# Patient Record
Sex: Male | Born: 1985 | Race: Black or African American | Hispanic: No | Marital: Single | Smoking: Never smoker
Health system: Southern US, Community
[De-identification: ages and names within clinical notes are randomized; demographics above are authoritative.]

## PROBLEM LIST (undated history)

## (undated) DIAGNOSIS — J45909 Unspecified asthma, uncomplicated: Secondary | ICD-10-CM

---
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
## 2006-05-13 ENCOUNTER — Emergency Department (HOSPITAL_COMMUNITY): Admission: EM | Admit: 2006-05-13 | Discharge: 2006-05-13 | Payer: Self-pay | Admitting: *Deleted

## 2006-07-31 ENCOUNTER — Emergency Department (HOSPITAL_COMMUNITY): Admission: EM | Admit: 2006-07-31 | Discharge: 2006-08-01 | Payer: Self-pay | Admitting: Emergency Medicine

## 2006-09-05 ENCOUNTER — Emergency Department (HOSPITAL_COMMUNITY): Admission: EM | Admit: 2006-09-05 | Discharge: 2006-09-05 | Payer: Self-pay | Admitting: Family Medicine

## 2008-01-29 ENCOUNTER — Emergency Department (HOSPITAL_COMMUNITY): Admission: EM | Admit: 2008-01-29 | Discharge: 2008-01-29 | Payer: Self-pay | Admitting: Family Medicine

## 2008-05-12 ENCOUNTER — Emergency Department (HOSPITAL_BASED_OUTPATIENT_CLINIC_OR_DEPARTMENT_OTHER): Admission: EM | Admit: 2008-05-12 | Discharge: 2008-05-12 | Payer: Self-pay | Admitting: Emergency Medicine

## 2008-06-18 ENCOUNTER — Emergency Department (HOSPITAL_COMMUNITY): Admission: EM | Admit: 2008-06-18 | Discharge: 2008-06-18 | Payer: Self-pay | Admitting: Emergency Medicine

## 2009-03-23 ENCOUNTER — Emergency Department (HOSPITAL_COMMUNITY): Admission: EM | Admit: 2009-03-23 | Discharge: 2009-03-23 | Payer: Self-pay | Admitting: Emergency Medicine

## 2009-11-05 ENCOUNTER — Emergency Department (HOSPITAL_COMMUNITY): Admission: EM | Admit: 2009-11-05 | Discharge: 2009-11-05 | Payer: Self-pay | Admitting: Emergency Medicine

## 2009-11-06 ENCOUNTER — Emergency Department (HOSPITAL_COMMUNITY): Admission: EM | Admit: 2009-11-06 | Discharge: 2009-11-07 | Payer: Self-pay | Admitting: Emergency Medicine

## 2010-04-18 IMAGING — CT CT HEAD W/O CM
1 series · 16 of 30 positions shown, 20 images · non-contrast
Comparison: None

CLINICAL DATA: Elevated blood pressure.  Nausea.  Vomiting.
Headache.

CT HEAD WITHOUT CONTRAST
TECHNIQUE: Contiguous axial images were obtained from the base of
the skull through the vertex without contrast.

[Series 2: head_seq 4.5 h37s st · axial · 0.43mm/px · z∈[+1052,+1196]mm · 16 of 36 slices shown, 20 images]
[im 2/36  brain]
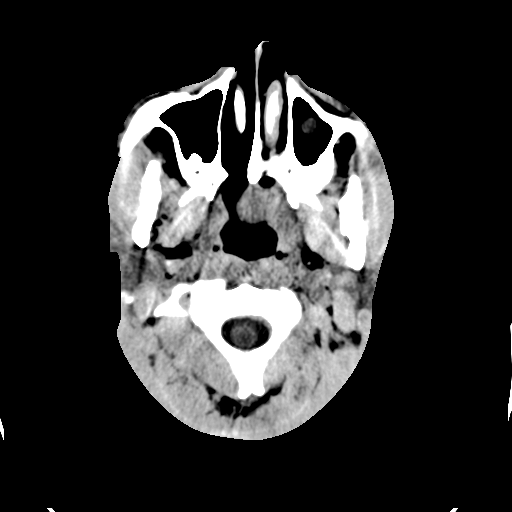
[im 2/36  bone]
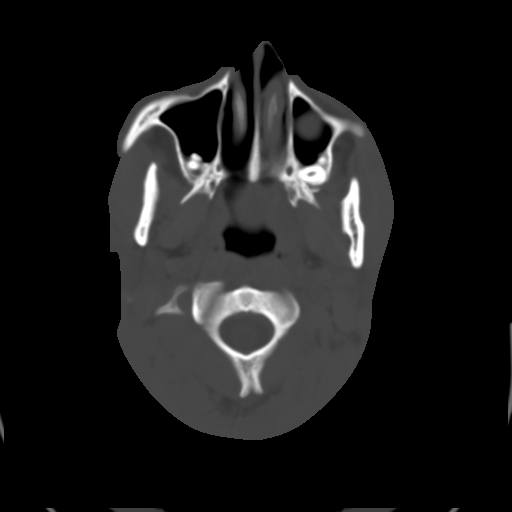
[im 4/36  brain]
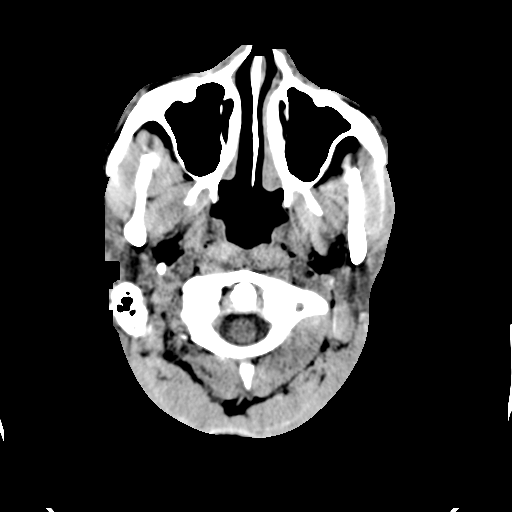
[im 7/36  brain]
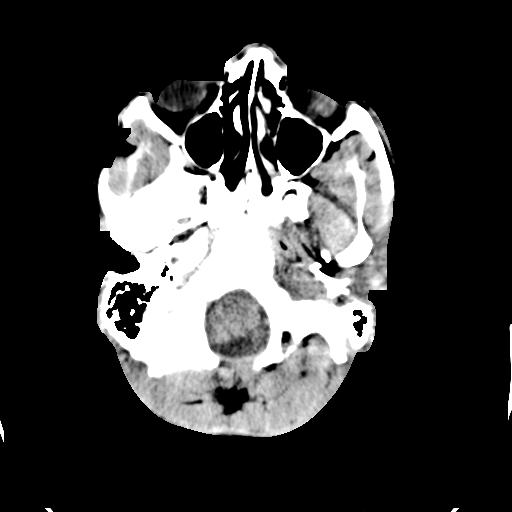
[im 9/36  brain]
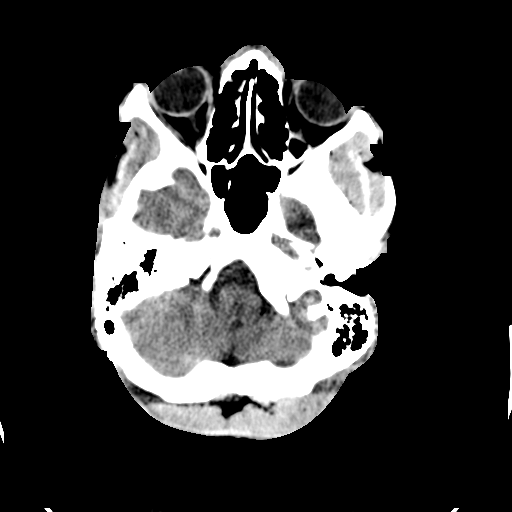
[im 10/36  brain]
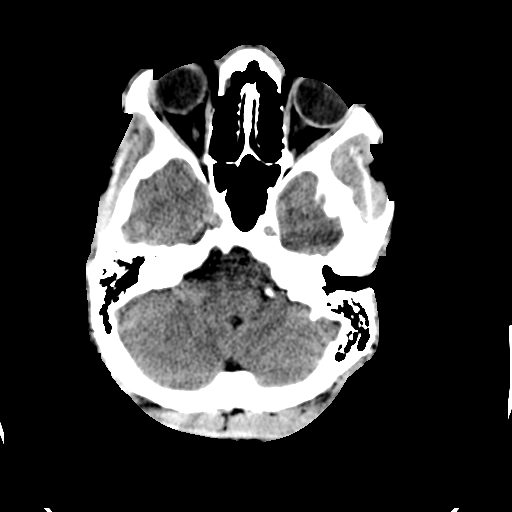
[im 10/36  bone]
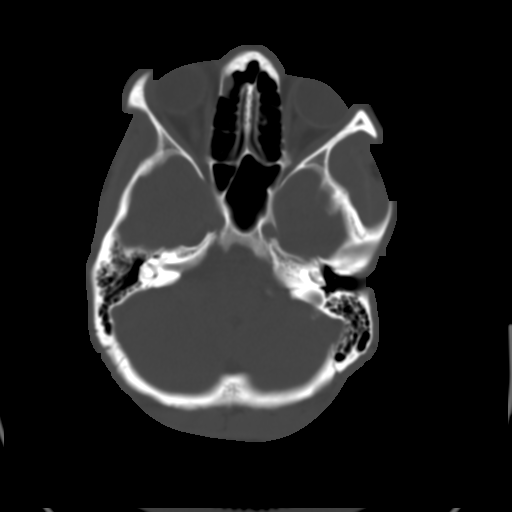
[im 13/36  brain]
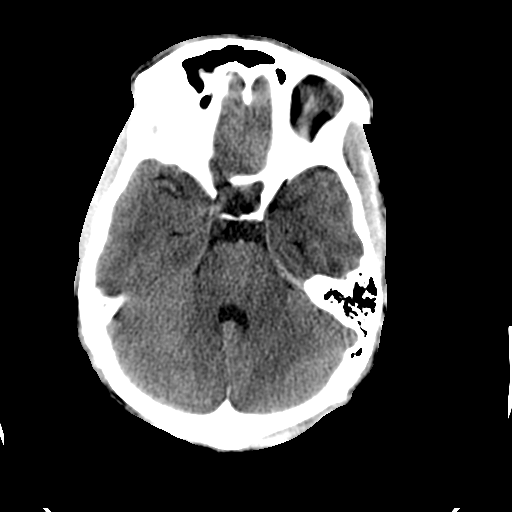
[im 15/36  brain]
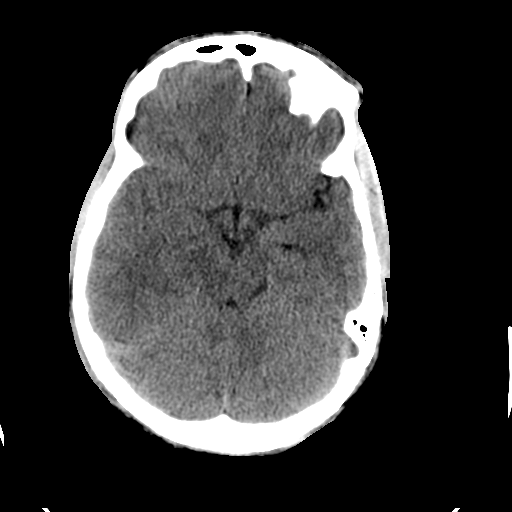
[im 17/36  brain]
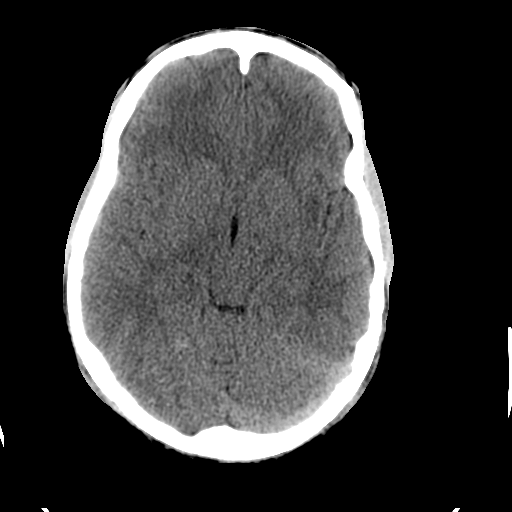
[im 19/36  brain]
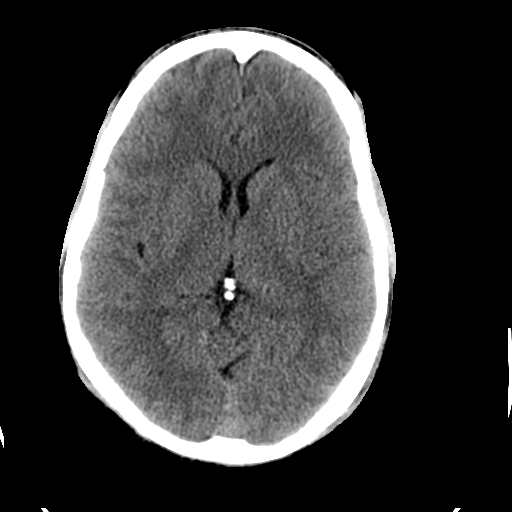
[im 19/36  bone]
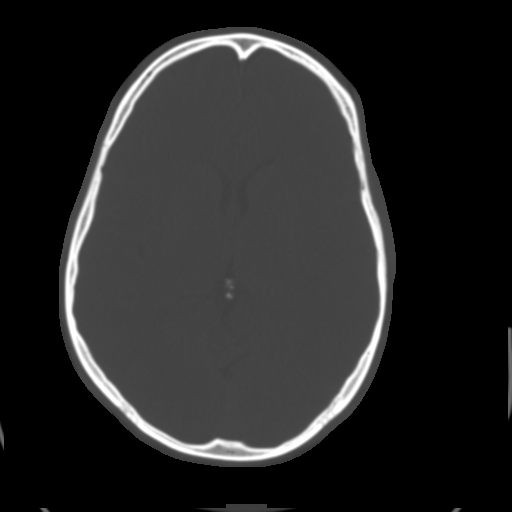
[im 21/36  brain]
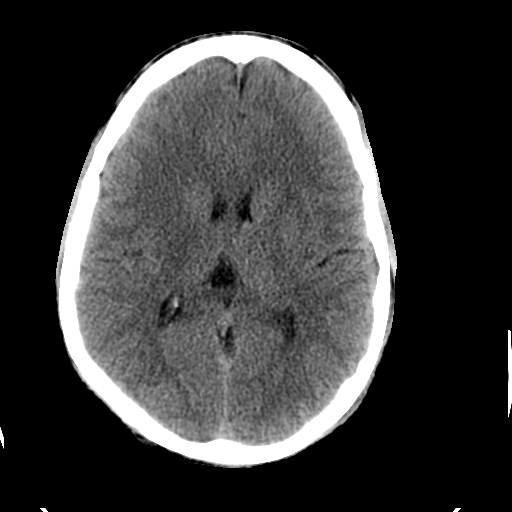
[im 23/36  brain]
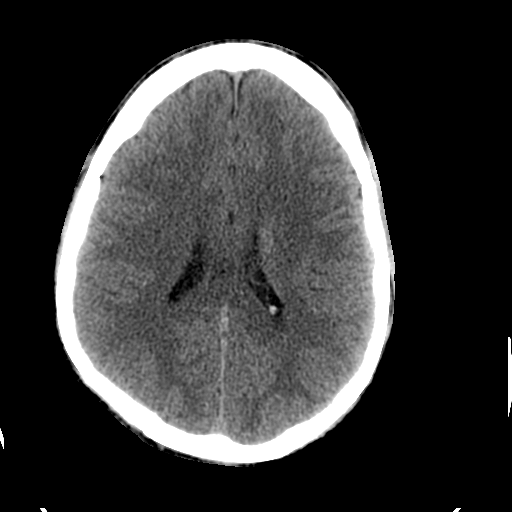
[im 26/36  brain]
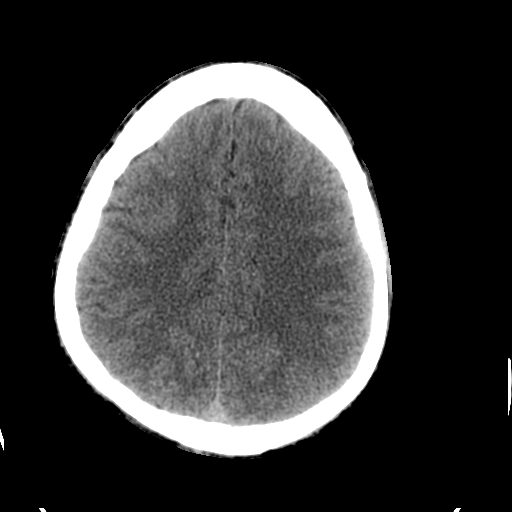
[im 27/36  brain]
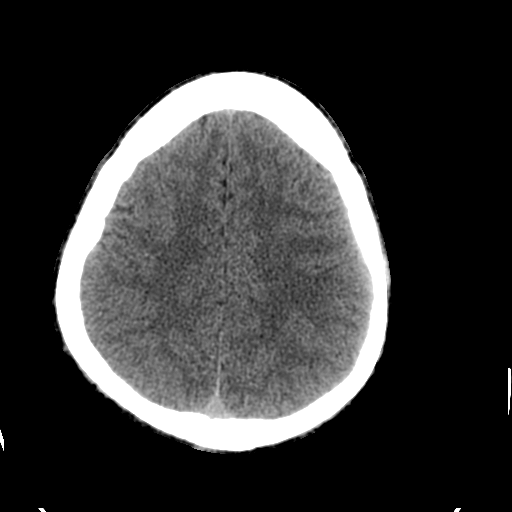
[im 27/36  bone]
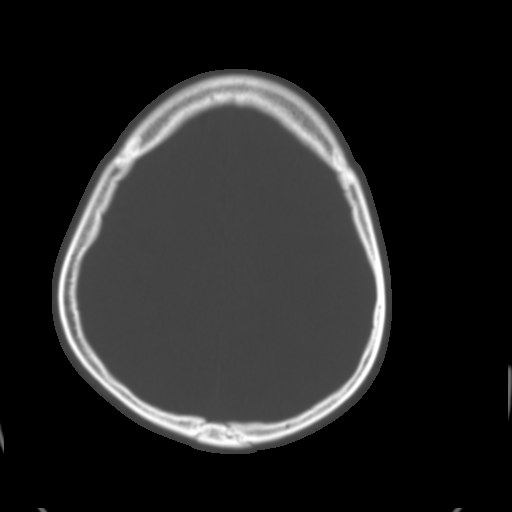
[im 29/36  brain]
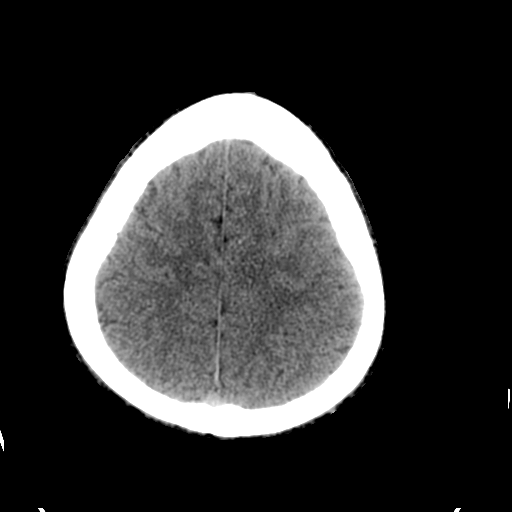
[im 32/36  brain]
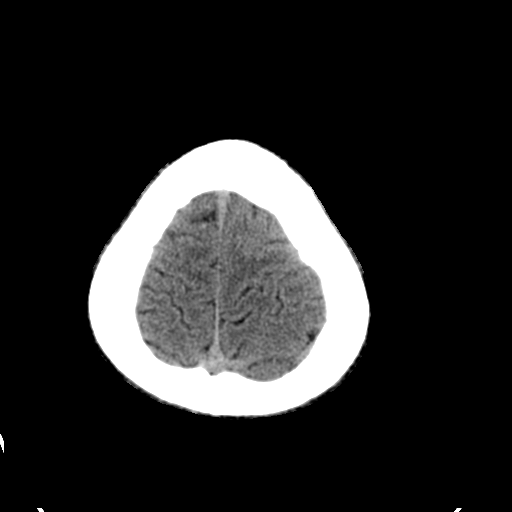
[im 34/36  brain]
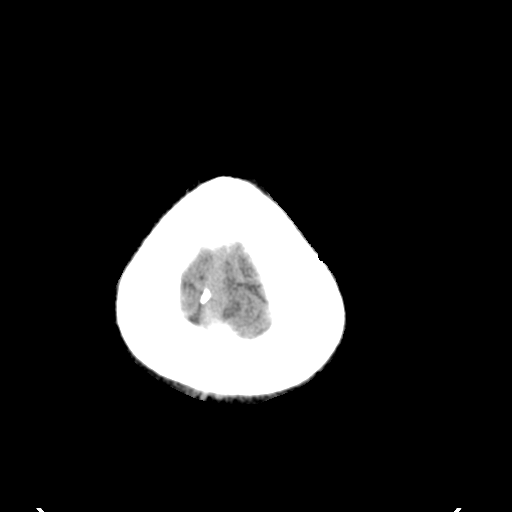

[16 of 30 positions shown; findings below may reference images not displayed]

FINDINGS: Left maxillary mucous retention cyst or polyp.  Frothy
material is present superior to the soft palate in the posterior
nasopharynx.  Scattered ethmoid air cell mucosal thickening.
Frontal sinuses clear.  Mastoid air cells clear. No mass lesion,
mass effect, midline shift, hydrocephalus, hemorrhage.  No
territorial ischemia or acute infarction.
IMPRESSION: No acute intracranial abnormality.  Likely chronic sinus disease.

## 2011-02-14 LAB — DIFFERENTIAL
Basophils Absolute: 0 10*3/uL (ref 0.0–0.1)
Basophils Relative: 1 % (ref 0–1)
Eosinophils Absolute: 0.1 10*3/uL (ref 0.0–0.7)
Eosinophils Relative: 3 % (ref 0–5)
Lymphocytes Relative: 41 % (ref 12–46)
Lymphs Abs: 2 10*3/uL (ref 0.7–4.0)
Monocytes Absolute: 0.5 10*3/uL (ref 0.1–1.0)
Monocytes Relative: 10 % (ref 3–12)
Neutro Abs: 2.2 10*3/uL (ref 1.7–7.7)
Neutrophils Relative %: 45 % (ref 43–77)

## 2011-02-14 LAB — COMPREHENSIVE METABOLIC PANEL
ALT: 23 U/L (ref 0–53)
AST: 33 U/L (ref 0–37)
Albumin: 4.5 g/dL (ref 3.5–5.2)
Alkaline Phosphatase: 56 U/L (ref 39–117)
BUN: 9 mg/dL (ref 6–23)
CO2: 25 mEq/L (ref 19–32)
Calcium: 9.3 mg/dL (ref 8.4–10.5)
Chloride: 105 mEq/L (ref 96–112)
Creatinine, Ser: 0.98 mg/dL (ref 0.4–1.5)
GFR calc Af Amer: >60 mL/min (ref >60)
GFR calc non Af Amer: >60 mL/min (ref >60)
Glucose, Bld: 145 mg/dL — ABNORMAL HIGH (ref 70–99)
Potassium: 3.1 mEq/L — ABNORMAL LOW (ref 3.5–5.1)
Sodium: 140 mEq/L (ref 135–145)
Total Bilirubin: 2 mg/dL — ABNORMAL HIGH (ref 0.3–1.2)
Total Protein: 7.4 g/dL (ref 6.0–8.3)

## 2011-02-14 LAB — URINALYSIS, ROUTINE W REFLEX MICROSCOPIC
Glucose, UA: NEGATIVE mg/dL
Leukocytes, UA: NEGATIVE
Protein, ur: 30 mg/dL — AB
Specific Gravity, Urine: 1.024 (ref 1.005–1.030)
pH: 7.5 (ref 5.0–8.0)

## 2011-02-14 LAB — CBC
HCT: 40.1 % (ref 39.0–52.0)
Hemoglobin: 13.7 g/dL (ref 13.0–17.0)
MCHC: 34.1 g/dL (ref 30.0–36.0)
MCV: 93.4 fL (ref 78.0–100.0)
Platelets: 198 10*3/uL (ref 150–400)
RBC: 4.29 MIL/uL (ref 4.22–5.81)
RDW: 12.9 % (ref 11.5–15.5)
WBC: 4.9 10*3/uL (ref 4.0–10.5)

## 2011-02-14 LAB — URINE MICROSCOPIC-ADD ON

## 2011-11-03 ENCOUNTER — Emergency Department (HOSPITAL_COMMUNITY): Payer: Self-pay

## 2011-11-03 ENCOUNTER — Emergency Department (HOSPITAL_COMMUNITY)
Admission: EM | Admit: 2011-11-03 | Discharge: 2011-11-04 | Disposition: A | Discharge status: Home / Self Care | Payer: Self-pay | Attending: Emergency Medicine | Admitting: Emergency Medicine

## 2011-11-03 ENCOUNTER — Encounter (HOSPITAL_COMMUNITY): Payer: Self-pay | Admitting: Emergency Medicine

## 2011-11-03 DIAGNOSIS — R05 Cough: Secondary | ICD-10-CM | POA: Insufficient documentation

## 2011-11-03 DIAGNOSIS — R059 Cough, unspecified: Secondary | ICD-10-CM | POA: Insufficient documentation

## 2011-11-03 DIAGNOSIS — J3489 Other specified disorders of nose and nasal sinuses: Secondary | ICD-10-CM | POA: Insufficient documentation

## 2011-11-03 DIAGNOSIS — IMO0001 Reserved for inherently not codable concepts without codable children: Secondary | ICD-10-CM | POA: Insufficient documentation

## 2011-11-03 DIAGNOSIS — J111 Influenza due to unidentified influenza virus with other respiratory manifestations: Secondary | ICD-10-CM | POA: Insufficient documentation

## 2011-11-03 DIAGNOSIS — R509 Fever, unspecified: Secondary | ICD-10-CM | POA: Insufficient documentation

## 2011-11-03 DIAGNOSIS — R000 Tachycardia, unspecified: Secondary | ICD-10-CM | POA: Insufficient documentation

## 2011-11-03 DIAGNOSIS — R070 Pain in throat: Secondary | ICD-10-CM | POA: Insufficient documentation

## 2011-11-03 DIAGNOSIS — R062 Wheezing: Secondary | ICD-10-CM | POA: Insufficient documentation

## 2011-11-03 DIAGNOSIS — R221 Localized swelling, mass and lump, neck: Secondary | ICD-10-CM | POA: Insufficient documentation

## 2011-11-03 DIAGNOSIS — R220 Localized swelling, mass and lump, head: Secondary | ICD-10-CM | POA: Insufficient documentation

## 2011-11-03 MED ORDER — ALBUTEROL SULFATE (5 MG/ML) 0.5% IN NEBU
2.5000 mg | INHALATION_SOLUTION | Freq: Once | RESPIRATORY_TRACT | Status: AC
  Administered 2011-11-03: 5 mg via RESPIRATORY_TRACT
  Filled 2011-11-03: qty 1

## 2011-11-03 MED ORDER — ACETAMINOPHEN 325 MG PO TABS
650.0000 mg | ORAL_TABLET | Freq: Once | ORAL | Status: AC
  Administered 2011-11-03: 650 mg via ORAL
  Filled 2011-11-03: qty 2

## 2011-11-03 NOTE — ED Provider Notes (Signed)
History     CSN: [id number]  Arrival date & time 11/03/11  2056   First MD Initiated Contact with Patient 11/03/11 2322      No chief complaint on file.   (Consider location/radiation/quality/duration/timing/severity/associated sxs/prior treatment) Patient is a 25 y.o. Davila presenting with URI. The history is provided by the patient.  URI The primary symptoms include fever, sore throat, cough, wheezing and myalgias. Primary symptoms do not include abdominal pain, nausea, vomiting or rash. The current episode started yesterday. This is a new problem. The problem has been rapidly worsening.  The maximum temperature recorded prior to his arrival was 103 to 104 F. The temperature was taken by an oral thermometer.  The sore throat began today. The sore throat has been unchanged since its onset. The sore throat is mild in intensity. The sore throat is not accompanied by trouble swallowing.  The cough began yesterday. The cough is non-productive.  Wheezing began yesterday. Wheezing occurs continuously. The wheezing has been unchanged since its onset. Precipitants: He was removing metal from a house yesterday and thought there may have been something that triggered his asthma. The patient's medical history is significant for asthma.  The onset of the illness is associated with exposure to sick contacts. Symptoms associated with the illness include chills, congestion and rhinorrhea.    Past Medical History  Diagnosis Date  . Arthritis     History reviewed. No pertinent past surgical history.  No family history on file.  History  Substance Use Topics  . Smoking status: Never Smoker   . Smokeless tobacco: Not on file  . Alcohol Use: No      Review of Systems  Constitutional: Positive for fever and chills.  HENT: Positive for congestion, sore throat and rhinorrhea. Negative for trouble swallowing.   Respiratory: Positive for cough and wheezing.   Gastrointestinal: Negative for  nausea, vomiting and abdominal pain.  Musculoskeletal: Positive for myalgias.  Skin: Negative for rash.  All other systems reviewed and are negative.    Allergies  Aspirin and Shellfish allergy  Home Medications   Current Outpatient Rx  Name Route Sig Dispense Refill  . ALBUTEROL SULFATE HFA 108 (90 BASE) MCG/ACT IN AERS Inhalation Inhale 2 puffs into the lungs every 6 (six) hours as needed. For wheezing     . ALBUTEROL SULFATE (2.5 MG/3ML) 0.083% IN NEBU Nebulization Take 2.5 mg by nebulization every 6 (six) hours as needed. For wheezing       BP 135/78  Pulse 112  Temp(Src) 102.6 F (39.2 C) (Oral)  Resp 20  SpO2 100%  Physical Exam  Nursing note and vitals reviewed. Constitutional: He is oriented to person, place, and time. He appears well-developed and well-nourished. No distress.  HENT:  Head: Normocephalic and atraumatic.  Right Ear: Tympanic membrane and ear canal normal.  Left Ear: Tympanic membrane and ear canal normal.  Nose: Mucosal edema and rhinorrhea present.  Mouth/Throat: Oropharynx is clear and moist and mucous membranes are normal. No oropharyngeal exudate, posterior oropharyngeal edema or posterior oropharyngeal erythema.  Eyes: Conjunctivae and EOM are normal. Pupils are equal, round, and reactive to light.  Neck: Normal range of motion. Neck supple.  Cardiovascular: Regular rhythm and intact distal pulses.  Tachycardia present.   No murmur heard. Pulmonary/Chest: Effort normal and breath sounds normal. No respiratory distress. He has no wheezes. He has no rales.  Abdominal: Soft. He exhibits no distension. There is no tenderness. There is no rebound and no guarding.  Musculoskeletal: Normal  range of motion. He exhibits no edema and no tenderness.  Neurological: He is alert and oriented to person, place, and time.  Skin: Skin is warm and dry. No rash noted. No erythema.  Psychiatric: He has a normal mood and affect. His behavior is normal.    ED  Course  Procedures (including critical care time)  Labs Reviewed - No data to display Dg Chest 2 View  11/04/2011  *RADIOLOGY REPORT*  Clinical Data: Cough, wheezing.  CHEST - 2 VIEW  Comparison: None.  Findings: Lungs are clear.  Nodule projecting over the lower chest bilaterally are favored to represent nipple shadows.  No pleural effusion or pneumothorax. The cardiomediastinal contours are within normal limits. The visualized bones and soft tissues are without significant appreciable abnormality.  IMPRESSION: No acute cardiopulmonary process.  Original Report Authenticated By: Waneta Martins, M.D.     No diagnosis found.    MDM   Pt with symptoms consistent with influenza.  Normal exam here but is febrile.  No signs of breathing difficulty  No signs of strep pharyngitis, otitis or abnormal abdominal findings.  However patient does have a history of asthma and states overnight he was extremely short of breath and wheezing despite using his inhaler. Patient was given albuterol when he arrived and on exam currently he is not wheezing and breath sounds are clear. CXR wnl.   Will continue antipyretica and rest and fluids and return for any further problems.         Gwyneth Sprout, MD 11/04/11 [phone]

## 2011-11-03 NOTE — ED Notes (Signed)
PT. REPORTS SOB , WITH PRODUCTIVE COUGH , FEVER WITH CHILLS ONSET YESTERDAY.

## 2011-11-03 NOTE — ED Notes (Signed)
TRIAGE INFORMATION ENTERED BY BOBBYS. RN NOT NURSE TECH.

## 2011-11-04 MED ORDER — OSELTAMIVIR PHOSPHATE 75 MG PO CAPS: 75.0000 mg | ORAL_CAPSULE | Freq: Two times a day (BID) | ORAL | Status: AC

## 2012-06-24 ENCOUNTER — Encounter (HOSPITAL_COMMUNITY): Payer: Self-pay | Admitting: *Deleted

## 2012-06-24 ENCOUNTER — Emergency Department (HOSPITAL_COMMUNITY)
Admission: EM | Admit: 2012-06-24 | Discharge: 2012-06-25 | Discharge status: Against Medical Advice | Payer: Self-pay | Attending: Emergency Medicine | Admitting: Emergency Medicine

## 2012-06-24 DIAGNOSIS — R1110 Vomiting, unspecified: Secondary | ICD-10-CM | POA: Insufficient documentation

## 2012-06-24 NOTE — ED Notes (Signed)
Pt states sudden onset of chills and rigors w/i past hour, copious vomiting in past hour > 6 times.

## 2012-06-24 NOTE — ED Notes (Signed)
Unable to obtain accurate temp due to pt shaking and mouth breathing

## 2016-09-02 ENCOUNTER — Encounter (HOSPITAL_COMMUNITY): Payer: Self-pay

## 2016-09-02 ENCOUNTER — Emergency Department (HOSPITAL_COMMUNITY)
Admission: EM | Admit: 2016-09-02 | Discharge: 2016-09-02 | Disposition: A | Discharge status: Home / Self Care | Payer: Self-pay | Attending: Emergency Medicine | Admitting: Emergency Medicine

## 2016-09-02 ENCOUNTER — Emergency Department (HOSPITAL_COMMUNITY): Payer: Self-pay

## 2016-09-02 DIAGNOSIS — Y939 Activity, unspecified: Secondary | ICD-10-CM | POA: Insufficient documentation

## 2016-09-02 DIAGNOSIS — Z23 Encounter for immunization: Secondary | ICD-10-CM | POA: Insufficient documentation

## 2016-09-02 DIAGNOSIS — J45909 Unspecified asthma, uncomplicated: Secondary | ICD-10-CM | POA: Insufficient documentation

## 2016-09-02 DIAGNOSIS — M71042 Abscess of bursa, left hand: Secondary | ICD-10-CM

## 2016-09-02 DIAGNOSIS — S61452A Open bite of left hand, initial encounter: Secondary | ICD-10-CM | POA: Insufficient documentation

## 2016-09-02 DIAGNOSIS — Y929 Unspecified place or not applicable: Secondary | ICD-10-CM | POA: Insufficient documentation

## 2016-09-02 DIAGNOSIS — Z87891 Personal history of nicotine dependence: Secondary | ICD-10-CM | POA: Insufficient documentation

## 2016-09-02 DIAGNOSIS — Y999 Unspecified external cause status: Secondary | ICD-10-CM | POA: Insufficient documentation

## 2016-09-02 DIAGNOSIS — W503XXA Accidental bite by another person, initial encounter: Secondary | ICD-10-CM

## 2016-09-02 HISTORY — DX: Unspecified asthma, uncomplicated: J45.909

## 2016-09-02 MED ORDER — HYDROCODONE-ACETAMINOPHEN 5-325 MG PO TABS: 1.0000 | ORAL_TABLET | Freq: Four times a day (QID) | ORAL | 0 refills | Status: DC | PRN

## 2016-09-02 MED ORDER — LIDOCAINE-EPINEPHRINE (PF) 2 %-1:200000 IJ SOLN: 10.0000 mL | Freq: Once | INTRAMUSCULAR | Status: DC

## 2016-09-02 MED ORDER — AMOXICILLIN-POT CLAVULANATE 875-125 MG PO TABS: 1.0000 | ORAL_TABLET | Freq: Two times a day (BID) | ORAL | 0 refills | Status: DC

## 2016-09-02 MED ORDER — SODIUM CHLORIDE 0.9 % IV SOLN
3.0000 g | Freq: Once | INTRAVENOUS | Status: AC
  Administered 2016-09-02: 3 g via INTRAVENOUS
  Filled 2016-09-02: qty 3

## 2016-09-02 MED ORDER — TETANUS-DIPHTH-ACELL PERTUSSIS 5-2.5-18.5 LF-MCG/0.5 IM SUSP
0.5000 mL | Freq: Once | INTRAMUSCULAR | Status: AC
Start: 2016-09-02 — End: 2016-09-02
  Administered 2016-09-02: 0.5 mL via INTRAMUSCULAR
  Filled 2016-09-02: qty 0.5

## 2016-09-02 MED ORDER — LIDOCAINE-EPINEPHRINE 1 %-1:100000 IJ SOLN
10.0000 mL | Freq: Once | INTRAMUSCULAR | Status: AC
  Administered 2016-09-02: 10 mL

## 2016-09-02 MED ORDER — LIDOCAINE-EPINEPHRINE (PF) 2 %-1:200000 IJ SOLN: 20.0000 mL | Freq: Once | INTRAMUSCULAR | Status: DC
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
## 2016-09-02 NOTE — ED Notes (Signed)
Suture cart at bedside 
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
## 2016-09-02 NOTE — Discharge Instructions (Signed)
Leave bandage on until Sunday On Sunday remove it and pull the packing Take your antibiotics until gone Work on finger motion like Dr. Janee Mornhompson showed you Make an appointment to see him if your finger motion isn't normal or the infection seems to be worsening or failing to improve.

## 2016-09-02 NOTE — ED Notes (Signed)
Pt verbalized understanding discharge instructions and denies any further needs or questions at this time. VS stable, ambulatory and steady gait.  [id number] 
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
## 2016-09-05 LAB — AEROBIC CULTURE W GRAM STAIN (SUPERFICIAL SPECIMEN)

## 2016-09-05 LAB — AEROBIC CULTURE  (SUPERFICIAL SPECIMEN)

## 2016-09-06 ENCOUNTER — Telehealth (HOSPITAL_BASED_OUTPATIENT_CLINIC_OR_DEPARTMENT_OTHER): Payer: Self-pay | Admitting: Emergency Medicine

## 2016-09-06 NOTE — Telephone Encounter (Signed)
Post ED Visit - Positive Culture Follow-up  Culture report reviewed by antimicrobial stewardship pharmacist:  []  Jay Davila, Pharm.D. []  Jay Davila, Pharm.D., BCPS []  Jay Davila, Pharm.D. []  Jay Davila, Pharm.D., BCPS []  Jay Davila, [address] BCPS, AAHIVP []  Jay Davila, Pharm.D., BCPS, AAHIVP []  Jay Davila, Pharm.D. []  Jay Davila, [address]Ph[address] Jay Davila  Positive wound culture Treated with augmentin, organism sensitive to the same and no further patient follow-up is required at this time.  Jay Davila, Jay Davila 09/06/2016, 9:23 AM
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
## 2021-08-26 ENCOUNTER — Ambulatory Visit (INDEPENDENT_AMBULATORY_CARE_PROVIDER_SITE_OTHER): Payer: Self-pay

## 2021-08-26 ENCOUNTER — Other Ambulatory Visit: Payer: Self-pay

## 2021-08-26 ENCOUNTER — Ambulatory Visit (HOSPITAL_COMMUNITY)
Admission: EM | Admit: 2021-08-26 | Discharge: 2021-08-26 | Disposition: A | Discharge status: Home / Self Care | Payer: Self-pay | Attending: Physician Assistant | Admitting: Physician Assistant

## 2021-08-26 ENCOUNTER — Encounter (HOSPITAL_COMMUNITY): Payer: Self-pay | Admitting: Emergency Medicine

## 2021-08-26 DIAGNOSIS — Z23 Encounter for immunization: Secondary | ICD-10-CM

## 2021-08-26 DIAGNOSIS — S61214A Laceration without foreign body of right ring finger without damage to nail, initial encounter: Secondary | ICD-10-CM

## 2021-08-26 DIAGNOSIS — Z113 Encounter for screening for infections with a predominantly sexual mode of transmission: Secondary | ICD-10-CM

## 2021-08-26 DIAGNOSIS — S6991XA Unspecified injury of right wrist, hand and finger(s), initial encounter: Secondary | ICD-10-CM | POA: Insufficient documentation

## 2021-08-26 MED ORDER — TETANUS-DIPHTH-ACELL PERTUSSIS 5-2.5-18.5 LF-MCG/0.5 IM SUSY
0.5000 mL | PREFILLED_SYRINGE | Freq: Once | INTRAMUSCULAR | Status: AC
  Administered 2021-08-26: 0.5 mL via INTRAMUSCULAR

## 2021-08-26 MED ORDER — TETANUS-DIPHTH-ACELL PERTUSSIS 5-2.5-18.5 LF-MCG/0.5 IM SUSY
PREFILLED_SYRINGE | INTRAMUSCULAR | Status: AC
Start: 2021-08-26 — End: ?
  Filled 2021-08-26: qty 0.5

## 2021-08-26 MED ORDER — CEPHALEXIN 500 MG PO CAPS: 500.0000 mg | ORAL_CAPSULE | Freq: Two times a day (BID) | ORAL | 0 refills | Status: DC
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
## 2021-08-26 NOTE — ED Provider Notes (Addendum)
[facility]    CSN: [id number] Arrival date & time: 08/26/21  1030      History   Chief Complaint Chief Complaint  Patient presents with   Finger Injury    HPI Jay Davila is a 35 y.o. male.   Patient presented with injury to his right middle finger.  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  He has a blood pressure with adequate hemostasis.  He is unsure when his last tetanus was.  He has not tried any over-the-counter medications for symptom management.  He has not cleaned the area with any products.  Pain is rated 3 on a 0-10 pain scale, localized to distal right ring finger, described as throbbing, no aggravating relieving factors identified.  He is right-handed.  He denies any numbness or paresthesias.  In addition, patient reports that he is interested in being tested for trichomonas.  He is concerned he might have been exposed by a sexual partner.  Denies any significant discharge or additional symptoms.   Past Medical History:  Diagnosis Date   Asthma     There are no problems to display for this patient.   History reviewed. No pertinent surgical history.     Home Medications    Prior to Admission medications   Medication Sig Start Date End Date Taking? Authorizing Provider  cephALEXin (KEFLEX) 500 MG capsule Take 1 capsule (500 mg total) by mouth 2 (two) times daily. 08/26/21  Yes Musab Wingard K, PA-C  albuterol (PROVENTIL HFA;VENTOLIN HFA) 108 (90 BASE) MCG/ACT inhaler Inhale 2 puffs into the lungs every 6 (six) hours as needed. For wheezing     [provider]  albuterol (PROVENTIL) (2.5 MG/3ML) 0.083% nebulizer solution Take 2.5 mg by nebulization every 6 (six) hours as needed. For wheezing     [provider]  HYDROcodone-acetaminophen (NORCO) 5-325 MG tablet Take 1-2 tablets by mouth every 6 (six) hours as needed for severe pain. 09/02/16   Mack Hook, MD  ibuprofen  (ADVIL,MOTRIN) 200 MG tablet Take 400 mg by mouth every 6 (six) hours as needed.    [provider]    Family History History reviewed. No pertinent family history.  Social History Social History   Tobacco Use   Smoking status: Former   Smokeless tobacco: Never  Substance Use Topics   Alcohol use: Yes    Comment: occasionally   Drug use: No     Allergies   Aspirin and Shellfish allergy   Review of Systems Review of Systems  Constitutional:  Positive for activity change. Negative for appetite change, fatigue and fever.  Respiratory:  Negative for cough and shortness of breath.   Cardiovascular:  Negative for chest pain.  Gastrointestinal:  Negative for abdominal pain, diarrhea, nausea and vomiting.  Genitourinary:  Negative for frequency, penile discharge, penile pain and testicular pain.  Musculoskeletal:  Positive for arthralgias. Negative for myalgias.  Skin:  Positive for wound. Negative for color change.  Neurological:  Negative for dizziness, weakness, light-headedness, numbness and headaches.    Physical Exam Triage Vital Signs ED Triage Vitals  Enc Vitals Group     BP 08/26/21 1208 121/73     Pulse Rate 08/26/21 1208 (!) 58     Resp 08/26/21 1208 16     Temp 08/26/21 1208 97.9 F (36.6 C)     Temp Source 08/26/21 1208 Oral     SpO2 08/26/21 1208 99 %  Weight --      Height --      Head Circumference --      Peak Flow --      Pain Score 08/26/21 1207 3     Pain Loc --      Pain Edu? --      Excl. in GC? --    No data found.  Updated Vital Signs BP 121/73 (BP Location: Right Arm)   Pulse (!) 58   Temp 97.9 F (36.6 C) (Oral)   Resp 16   SpO2 99%   Visual Acuity Right Eye Distance:   Left Eye Distance:   Bilateral Distance:    Right Eye Near:   Left Eye Near:    Bilateral Near:     Physical Exam Vitals reviewed.  Constitutional:      General: He is awake.     Appearance: Normal appearance. He is well-developed. He is not  ill-appearing.     Comments: Very pleasant male appears stated age no acute distress sitting comfortably in exam room  HENT:     Head: Normocephalic and atraumatic.     Mouth/Throat:     Pharynx: No oropharyngeal exudate, posterior oropharyngeal erythema or uvula swelling.  Cardiovascular:     Rate and Rhythm: Normal rate and regular rhythm.     Pulses:          Radial pulses are 2+ on the right side and 2+ on the left side.     Heart sounds: Normal heart sounds, S1 normal and S2 normal. No murmur heard.    Comments: Capillary refill within 2 seconds right ring finger Pulmonary:     Effort: Pulmonary effort is normal.     Breath sounds: Normal breath sounds. No stridor. No wheezing, rhonchi or rales.     Comments: Clear to auscultation bilaterally Skin:    Findings: Laceration present.     Comments: 3 cm semicircular laceration noted on right ring finger pad.  No active bleeding.  Neurological:     Mental Status: He is alert.  Psychiatric:        Behavior: Behavior is cooperative.     UC Treatments / Results  Labs (all labs ordered are listed, but only abnormal results are displayed) Labs Reviewed  CYTOLOGY, (ORAL, ANAL, URETHRAL) ANCILLARY ONLY    EKG   Radiology DG Finger Ring Right  Result Date: 08/26/2021 CLINICAL DATA:  injury at construction site, ensure no retained foreign body or fracture EXAM: RIGHT RING FINGER 2+V COMPARISON:  None. FINDINGS: There is no evidence of acute fracture or dislocation. No evidence of radiopaque foreign body. IMPRESSION: No acute osseous abnormality or evidence of radiopaque foreign body. Electronically Signed   By: Caprice Renshaw M.D.   On: 08/26/2021 12:53    Procedures Laceration Repair  Date/Time: 08/26/2021 1:02 PM Performed by: Jeani Hawking, PA-C Authorized by: Jeani Hawking, PA-C   Consent:    Consent obtained:  Verbal   Consent given by:  Patient   Risks, benefits, and alternatives were discussed: yes     Risks  discussed:  Infection, pain, retained foreign body, poor cosmetic result and poor wound healing   Alternatives discussed:  No treatment, observation and referral Universal protocol:    Procedure explained and questions answered to patient or proxy's satisfaction: yes     Test results available: yes     Imaging studies available: yes     Patient identity confirmed:  Verbally with patient Anesthesia:  Anesthesia method:  Nerve block   Block needle gauge:  27 G   Block anesthetic:  Lidocaine 1% w/o epi   Block technique:  Digital   Block injection procedure:  Anatomic landmarks identified, introduced needle, negative aspiration for blood, incremental injection and anatomic landmarks palpated   Block outcome:  Anesthesia achieved Laceration details:    Location:  Finger   Finger location:  R ring finger   Length (cm):  3   Depth (mm):  2 Pre-procedure details:    Preparation:  Patient was prepped and draped in usual sterile fashion Exploration:    Hemostasis achieved with:  Direct pressure   Imaging obtained: x-ray     Imaging outcome: foreign body not noted     Contaminated: yes   Treatment:    Area cleansed with:  Chlorhexidine, saline and soap and water   Amount of cleaning:  Extensive   Irrigation solution:  Sterile saline   Irrigation volume:  20 mL   Irrigation method:  Syringe   Debridement:  None   Undermining:  None Skin repair:    Repair method:  Sutures   Suture size:  6-0   Suture material:  Prolene   Suture technique:  Simple interrupted   Number of sutures:  3 Approximation:    Approximation:  Close Repair type:    Repair type:  Simple Post-procedure details:    Dressing:  Non-adherent dressing   Procedure completion:  Tolerated well, no immediate complications (including critical care time)  Medications Ordered in UC Medications  Tdap (BOOSTRIX) injection 0.5 mL (0.5 mLs Intramuscular Given 08/26/21 1300)    Initial Impression / Assessment and Plan /  UC Course  I have reviewed the triage vital signs and the nursing notes.  Pertinent labs & imaging results that were available during my care of the patient were reviewed by me and considered in my medical decision making (see chart for details).      X-ray obtained to rule out tuft fracture and radiopaque foreign body; no osseous abnormality noted on x-ray.  Area was cleaned and laceration was repaired (see procedure note above).  Patient was placed in finger splint to aid with healing given contaminated nature of wound will start Keflex.  Tetanus was updated today.  Patient can use Tylenol for pain relief.  Discussed alarm symptoms that warrant reevaluation.  Should return precautions given to which patient expressed understanding.  He is to follow-up in 10 to 14 days for suture removal unless need to be seen sooner.  STI swab was collected by patient today.  We will determine if treatment is necessary based on laboratory results.  Final Clinical Impressions(s) / UC Diagnoses   Final diagnoses:  Laceration of right ring finger without foreign body without damage to nail, initial encounter  Injury of finger of right hand, initial encounter  Routine screening for STI (sexually transmitted infection)     Discharge Instructions      Your x-ray was normal with no obvious foreign body or fracture.  We updated your tetanus today.  Please start Keflex given your wound was contaminated.  Keep area clean and dry.  If you have any signs of infection you need to be reevaluated.  Return in 10 to 14 days for suture removal.  If you have any numbness, swelling, increased pain you need to be seen immediately.     ED Prescriptions     Medication Sig Dispense Auth. Provider   cephALEXin (KEFLEX) 500 MG capsule Take  1 capsule (500 mg total) by mouth 2 (two) times daily. 21 capsule Shelagh Rayman K, PA-C      PDMP not reviewed this encounter.   Jeani Hawking, PA-C 08/26/21 1301    RaspetNoberto Retort, PA-C 08/26/21 1304

## 2021-08-26 NOTE — Discharge Instructions (Addendum)
Your x-ray was normal with no obvious foreign body or fracture.  We updated your tetanus today.  Please start Keflex given your wound was contaminated.  Keep area clean and dry.  If you have any signs of infection you need to be reevaluated.  Return in 10 to 14 days for suture removal.  If you have any numbness, swelling, increased pain you need to be seen immediately.

## 2021-08-27 LAB — CYTOLOGY, (ORAL, ANAL, URETHRAL) ANCILLARY ONLY
Chlamydia: NEGATIVE
Comment: NEGATIVE
Comment: NEGATIVE
Comment: NORMAL
Neisseria Gonorrhea: NEGATIVE
Trichomonas: NEGATIVE

## 2022-11-17 ENCOUNTER — Encounter (HOSPITAL_COMMUNITY): Payer: Self-pay

## 2022-11-17 ENCOUNTER — Emergency Department (HOSPITAL_COMMUNITY)
Admission: EM | Admit: 2022-11-17 | Discharge: 2022-11-17 | Disposition: A | Discharge status: Home / Self Care | Payer: BLUE CROSS/BLUE SHIELD | Attending: Emergency Medicine | Admitting: Emergency Medicine

## 2022-11-17 ENCOUNTER — Other Ambulatory Visit: Payer: Self-pay

## 2022-11-17 ENCOUNTER — Emergency Department (HOSPITAL_COMMUNITY): Payer: BLUE CROSS/BLUE SHIELD

## 2022-11-17 DIAGNOSIS — R109 Unspecified abdominal pain: Secondary | ICD-10-CM | POA: Diagnosis present

## 2022-11-17 DIAGNOSIS — N132 Hydronephrosis with renal and ureteral calculous obstruction: Secondary | ICD-10-CM | POA: Diagnosis not present

## 2022-11-17 DIAGNOSIS — N201 Calculus of ureter: Secondary | ICD-10-CM

## 2022-11-17 DIAGNOSIS — N1330 Unspecified hydronephrosis: Secondary | ICD-10-CM

## 2022-11-17 LAB — COMPREHENSIVE METABOLIC PANEL
ALT: 16 U/L (ref 0–44)
AST: 24 U/L (ref 15–41)
Albumin: 4.5 g/dL (ref 3.5–5.0)
Alkaline Phosphatase: 66 U/L (ref 38–126)
Anion gap: 7 (ref 5–15)
BUN: 17 mg/dL (ref 6–20)
CO2: 26 mmol/L (ref 22–32)
Calcium: 9.2 mg/dL (ref 8.9–10.3)
Chloride: 106 mmol/L (ref 98–111)
Creatinine, Ser: 1.19 mg/dL (ref 0.61–1.24)
GFR, Estimated: >60 mL/min (ref >60)
Glucose, Bld: 95 mg/dL (ref 70–99)
Potassium: 4.2 mmol/L (ref 3.5–5.1)
Sodium: 139 mmol/L (ref 135–145)
Total Bilirubin: 1 mg/dL (ref 0.3–1.2)
Total Protein: 7.5 g/dL (ref 6.5–8.1)

## 2022-11-17 LAB — CBC
HCT: 44.4 % (ref 39.0–52.0)
Hemoglobin: 14.7 g/dL (ref 13.0–17.0)
MCH: 31.5 pg (ref 26.0–34.0)
MCHC: 33.1 g/dL (ref 30.0–36.0)
MCV: 95.1 fL (ref 80.0–100.0)
Platelets: 239 10*3/uL (ref 150–400)
RBC: 4.67 MIL/uL (ref 4.22–5.81)
RDW: 12.4 % (ref 11.5–15.5)
WBC: 7.6 10*3/uL (ref 4.0–10.5)
nRBC: 0 % (ref 0.0–0.2)

## 2022-11-17 LAB — URINALYSIS, ROUTINE W REFLEX MICROSCOPIC
Bilirubin Urine: NEGATIVE
Glucose, UA: NEGATIVE mg/dL
Ketones, ur: NEGATIVE mg/dL
Leukocytes,Ua: NEGATIVE
Nitrite: NEGATIVE
Protein, ur: NEGATIVE mg/dL
RBC / HPF: >50 RBC/hpf — ABNORMAL HIGH (ref 0–5)
Specific Gravity, Urine: 1.011 (ref 1.005–1.030)
pH: 6 (ref 5.0–8.0)

## 2022-11-17 LAB — LIPASE, BLOOD: Lipase: 37 U/L (ref 11–51)

## 2022-11-17 MED ORDER — TAMSULOSIN HCL 0.4 MG PO CAPS: 0.4000 mg | ORAL_CAPSULE | Freq: Every day | ORAL | 0 refills | Status: DC

## 2022-11-17 MED ORDER — OXYCODONE-ACETAMINOPHEN 5-325 MG PO TABS
1.0000 | ORAL_TABLET | Freq: Once | ORAL | Status: AC
  Administered 2022-11-17: 1 via ORAL
  Filled 2022-11-17: qty 1

## 2022-11-17 MED ORDER — ONDANSETRON 4 MG PO TBDP
4.0000 mg | ORAL_TABLET | Freq: Once | ORAL | Status: AC
  Administered 2022-11-17: 4 mg via ORAL
  Filled 2022-11-17: qty 1

## 2022-11-17 MED ORDER — ONDANSETRON HCL 4 MG PO TABS: 4.0000 mg | ORAL_TABLET | Freq: Four times a day (QID) | ORAL | 0 refills | Status: DC

## 2022-11-17 NOTE — ED Triage Notes (Signed)
Pt c/o N/V, right flank pain, right lower back pain started today. Pt states he urinated, but he feels like he couldn't completely empty bladder.

## 2022-11-17 NOTE — ED Provider Triage Note (Signed)
Emergency Medicine Provider Triage Evaluation Note  Jay Davila , a 37 y.o. male  was evaluated in triage.  Pt complains of right flank pain onset 9 AM.  Has associated nausea and vomiting.  Still has his gallbladder and appendix.  Denies urinary symptoms.  Denies constipation, diarrhea.  Last bowel movement was this morning.  Notes that he has recently changed his diet to eat more fruits and veggies..  Review of Systems  Positive:  Negative:   Physical Exam  BP 113/79 (BP Location: Right Arm)   Pulse 73   Temp 98.5 F (36.9 C) (Oral)   Resp 16   Ht 5\' 7"  (1.702 m)   Wt 63.5 kg   SpO2 100%   BMI 21.93 kg/m  Gen:   Awake, no distress   Resp:  Normal effort  MSK:   Moves extremities without difficulty  Other:  Right CVA tenderness to palpation.  Right upper quadrant tenderness to palpation.  Medical Decision Making  Medically screening exam initiated at 12:39 PM.  Appropriate orders placed.  Jay Davila was informed that the remainder of the evaluation will be completed by another provider, this initial triage assessment does not replace that evaluation, and the importance of remaining in the ED until their evaluation is complete.     Marcquis Ridlon A, PA-C 11/17/22 1241

## 2022-11-17 NOTE — ED Notes (Signed)
Patient is alert and oriented.  He reports onset of abd pain, felt like gas that was trapped last night.  He then developed onset of  n/v/d.  Patient continues to have pain in the right side but it has decreased post medication.  His n/v is also resolved post medication.  He reports he has changed his diet of recent to "detox" eating natural and  healthy foods.

## 2022-11-17 NOTE — Discharge Instructions (Addendum)
Please follow-up with your primary care doctor, and urology as necessary.  He should take ibuprofen and Tylenol, to help decrease inflammation.  It shows that you have some slight backup from your kidney, I believe this is likely secondary to a recently passed kidney stone.  If you start developing fever, chills, increased urinary frequency, severe abdominal plain please return to the ER.

## 2022-11-17 NOTE — ED Provider Notes (Signed)
Jay Davila [facility] EMERGENCY DEPARTMENT Provider Note   CSN: [id number] Arrival date & time: 11/17/22  1028     History  Chief Complaint  Patient presents with   Abdominal Pain   Emesis    Jay Davila is a 37 y.o. male, no pertinent past medical history, who presents to the ED secondary to severe right flank pain that started around 9 AM this morning, he states that it hit him like a ton of bricks, and came on suddenly when he was on the toilet.  States he had severe nausea, and pain and started vomiting.  Came to the ER, and was given Percocet, with relief of his symptoms.  Denies any gross hematuria.  Does endorse feeling like he has to urinate but can't.  He states his pain is much better now. No concern for STDs.  No testicular pain.    Home Medications Prior to Admission medications   Medication Sig Start Date End Date Taking? Authorizing Provider  ondansetron (ZOFRAN) 4 MG tablet Take 1 tablet (4 mg total) by mouth every 6 (six) hours. 11/17/22  Yes Octavious Zidek L, PA  tamsulosin (FLOMAX) 0.4 MG CAPS capsule Take 1 capsule (0.4 mg total) by mouth daily. 11/17/22  Yes Belita Warsame L, PA  albuterol (PROVENTIL HFA;VENTOLIN HFA) 108 (90 BASE) MCG/ACT inhaler Inhale 2 puffs into the lungs every 6 (six) hours as needed. For wheezing     [provider]  albuterol (PROVENTIL) (2.5 MG/3ML) 0.083% nebulizer solution Take 2.5 mg by nebulization every 6 (six) hours as needed. For wheezing     [provider]  cephALEXin (KEFLEX) 500 MG capsule Take 1 capsule (500 mg total) by mouth 2 (two) times daily. 08/26/21   Raspet, Noberto Retort, PA-C  HYDROcodone-acetaminophen (NORCO) 5-325 MG tablet Take 1-2 tablets by mouth every 6 (six) hours as needed for severe pain. 09/02/16   Mack Hook, MD  ibuprofen (ADVIL,MOTRIN) 200 MG tablet Take 400 mg by mouth every 6 (six) hours as needed.    [provider]      Allergies    Aspirin and Shellfish allergy     Review of Systems   Review of Systems  Constitutional:  Negative for fever.  Gastrointestinal:  Positive for abdominal pain, nausea and vomiting.  Genitourinary:  Positive for difficulty urinating.    Physical Exam Updated Vital Signs BP 109/85 (BP Location: Right Arm)   Pulse (!) 58   Temp 98.6 F (37 C) (Oral)   Resp 12   Ht 5\' 7"  (1.702 m)   Wt 63.5 kg   SpO2 99%   BMI 21.93 kg/m  Physical Exam Vitals and nursing note reviewed.  Constitutional:      General: He is not in acute distress.    Appearance: He is well-developed.  HENT:     Head: Normocephalic and atraumatic.  Eyes:     Conjunctiva/sclera: Conjunctivae normal.  Cardiovascular:     Rate and Rhythm: Normal rate and regular rhythm.     Heart sounds: No murmur heard. Pulmonary:     Effort: Pulmonary effort is normal. No respiratory distress.     Breath sounds: Normal breath sounds.  Abdominal:     Palpations: Abdomen is soft.     Tenderness: There is abdominal tenderness.     Comments: TTP of R flank w/o CVA ttp  Musculoskeletal:        General: No swelling.     Cervical back: Neck supple.  Skin:  General: Skin is warm and dry.     Capillary Refill: Capillary refill takes less than 2 seconds.  Neurological:     Mental Status: He is alert.  Psychiatric:        Mood and Affect: Mood normal.     ED Results / Procedures / Treatments   Labs (all labs ordered are listed, but only abnormal results are displayed) Labs Reviewed  URINALYSIS, ROUTINE W REFLEX MICROSCOPIC - Abnormal; Notable for the following components:      Result Value   Hgb urine dipstick LARGE (*)    RBC / HPF >50 (*)    Bacteria, UA RARE (*)    All other components within normal limits  URINE CULTURE  LIPASE, BLOOD  COMPREHENSIVE METABOLIC PANEL  CBC    EKG None  Radiology CT Renal Stone Study  Result Date: 11/17/2022 CLINICAL DATA:  Flank pain EXAM: CT ABDOMEN AND PELVIS WITHOUT CONTRAST TECHNIQUE: Multidetector CT  imaging of the abdomen and pelvis was performed following the standard protocol without IV contrast. RADIATION DOSE REDUCTION: This exam was performed according to the departmental dose-optimization program which includes automated exposure control, adjustment of the mA and/or kV according to patient size and/or use of iterative reconstruction technique. COMPARISON:  None Available. FINDINGS: Lower chest: No acute abnormality. Hepatobiliary: No focal liver abnormality is seen. No gallstones, gallbladder wall thickening, or biliary dilatation. Pancreas: Unremarkable. No pancreatic ductal dilatation or surrounding inflammatory changes. Spleen: Normal in size without focal abnormality. Adrenals/Urinary Tract: Punctate midpole 1 mm right kidney stone identified. There is right-sided mild hydronephrosis and some perinephric stranding. No ureteral stones identified. These findings could be related to recently passed stone and obstruction or pyelonephritis. No nephrolithiasis or hydronephrosis on the left. Unremarkable adrenal glands and urinary bladder. Stomach/Bowel: Stomach is within normal limits. Appendix appears normal. No evidence of bowel wall thickening, distention, or inflammatory changes. Vascular/Lymphatic: No significant vascular findings are present. No enlarged abdominal or pelvic lymph nodes. Reproductive: Prostate is unremarkable. Other: No abdominal wall hernia or abnormality. No abdominopelvic ascites. Musculoskeletal: No acute or significant osseous findings. IMPRESSION: 1. Punctate mm sized midpole right kidney stone. 2. Right kidney mild hydronephrosis and perinephric stranding without ureteral stone. This could indicate pyelonephritis or recent passage of an obstructing stone. Electronically Signed   By: Sammie Bench M.D.   On: 11/17/2022 14:54    Procedures Procedures    Medications Ordered in ED Medications  ondansetron (ZOFRAN-ODT) disintegrating tablet 4 mg (4 mg Oral Given 11/17/22  1245)  oxyCODONE-acetaminophen (PERCOCET/ROXICET) 5-325 MG per tablet 1 tablet (1 tablet Oral Given 11/17/22 1244)    ED Course/ Medical Decision Making/ A&P                           Medical Decision Making Patient is a 37 year old male, no pertinent past medical history, here for right flank pain that started around 9 AM today, associated nausea, vomiting no fever or chills.  We will obtain a CT renal study, CBC, CMP, and urinalysis for further evaluation.  Amount and/or Complexity of Data Reviewed Labs: ordered.    Details: Urine shows many red blood cells, trace bacteria, no leukocytes.  No evidence of leukocytosis per labs. Radiology: ordered.    Details: CT renal study shows hydronephrosis of the right kidney, 1 Jay Davila punctate kidney stone in the right kidney, no ureteral stone. Discussion of management or test interpretation with external provider(s): Discussed with patient, I believe that he likely  passed a kidney stone, and his hydronephrosis is resolving.  His pain is better, and we will start him on Flomax as he has 1 more punctate kidney stone.  And also give him information for urology.  We discussed importance of follow-up with primary care doctor, although is his findings possible pyelonephritis he only has tried trace bacteria, not has not had any fevers, CVA tenderness, chills, or any kind of increased frequency urgency of urination prior to today.  Denies risk of STDs.  We discussed return symptoms, and he voiced understanding.   Final Clinical Impression(s) / ED Diagnoses Final diagnoses:  Hydronephrosis, unspecified hydronephrosis type  Ureteral stone    Rx / DC Orders ED Discharge Orders          Ordered    tamsulosin (FLOMAX) 0.4 MG CAPS capsule  Daily        11/17/22 1512    ondansetron (ZOFRAN) 4 MG tablet  Every 6 hours        11/17/22 1512              Welborn Keena, [place], [place] 11/17/22 1524    Ezequiel Essex, MD 11/17/22 1627

## 2022-11-17 NOTE — ED Notes (Signed)
Patient verbalized understanding of discharge instructions and reasons to return to the ED 

## 2022-11-18 LAB — URINE CULTURE: Culture: NO GROWTH

## 2022-11-21 ENCOUNTER — Emergency Department (HOSPITAL_COMMUNITY): Payer: BLUE CROSS/BLUE SHIELD

## 2022-11-21 ENCOUNTER — Other Ambulatory Visit: Payer: Self-pay

## 2022-11-21 ENCOUNTER — Emergency Department (HOSPITAL_COMMUNITY)
Admission: EM | Admit: 2022-11-21 | Discharge: 2022-11-21 | Disposition: A | Discharge status: Home / Self Care | Payer: BLUE CROSS/BLUE SHIELD | Attending: Emergency Medicine | Admitting: Emergency Medicine

## 2022-11-21 DIAGNOSIS — W010XXA Fall on same level from slipping, tripping and stumbling without subsequent striking against object, initial encounter: Secondary | ICD-10-CM | POA: Diagnosis not present

## 2022-11-21 DIAGNOSIS — S92901A Unspecified fracture of right foot, initial encounter for closed fracture: Secondary | ICD-10-CM

## 2022-11-21 DIAGNOSIS — S92221A Displaced fracture of lateral cuneiform of right foot, initial encounter for closed fracture: Secondary | ICD-10-CM | POA: Insufficient documentation

## 2022-11-21 DIAGNOSIS — M7989 Other specified soft tissue disorders: Secondary | ICD-10-CM | POA: Insufficient documentation

## 2022-11-21 DIAGNOSIS — S99921A Unspecified injury of right foot, initial encounter: Secondary | ICD-10-CM | POA: Diagnosis present

## 2022-11-21 LAB — CBC
HCT: 42 % (ref 39.0–52.0)
Hemoglobin: 13.8 g/dL (ref 13.0–17.0)
MCH: 31.2 pg (ref 26.0–34.0)
MCHC: 32.9 g/dL (ref 30.0–36.0)
MCV: 94.8 fL (ref 80.0–100.0)
Platelets: 248 10*3/uL (ref 150–400)
RBC: 4.43 MIL/uL (ref 4.22–5.81)
RDW: 12.3 % (ref 11.5–15.5)
WBC: 7 10*3/uL (ref 4.0–10.5)
nRBC: 0 % (ref 0.0–0.2)

## 2022-11-21 LAB — BASIC METABOLIC PANEL
Anion gap: 10 (ref 5–15)
BUN: 14 mg/dL (ref 6–20)
CO2: 26 mmol/L (ref 22–32)
Calcium: 9.6 mg/dL (ref 8.9–10.3)
Chloride: 100 mmol/L (ref 98–111)
Creatinine, Ser: 1.14 mg/dL (ref 0.61–1.24)
GFR, Estimated: >60 mL/min (ref >60)
Glucose, Bld: 89 mg/dL (ref 70–99)
Potassium: 3.9 mmol/L (ref 3.5–5.1)
Sodium: 136 mmol/L (ref 135–145)

## 2022-11-21 MED ORDER — ONDANSETRON HCL 4 MG/2ML IJ SOLN: 4.0000 mg | Freq: Once | INTRAMUSCULAR | Status: DC

## 2022-11-21 MED ORDER — OXYCODONE-ACETAMINOPHEN 5-325 MG PO TABS: 1.0000 | ORAL_TABLET | Freq: Four times a day (QID) | ORAL | 0 refills | Status: AC | PRN

## 2022-11-21 MED ORDER — KETAMINE HCL 50 MG/5ML IJ SOSY
1.0000 mg/kg | PREFILLED_SYRINGE | Freq: Once | INTRAMUSCULAR | Status: AC
  Administered 2022-11-21: 50 mg via INTRAVENOUS
  Filled 2022-11-21: qty 10

## 2022-11-21 MED ORDER — SODIUM CHLORIDE 0.9 % IV BOLUS (SEPSIS)
1000.0000 mL | Freq: Once | INTRAVENOUS | Status: AC
  Administered 2022-11-21: 1000 mL via INTRAVENOUS

## 2022-11-21 MED ORDER — FENTANYL CITRATE PF 50 MCG/ML IJ SOSY
75.0000 ug | PREFILLED_SYRINGE | INTRAMUSCULAR | Status: DC | PRN
  Administered 2022-11-21: 75 ug via INTRAVENOUS
  Filled 2022-11-21: qty 2

## 2022-11-21 MED ORDER — PROPOFOL 10 MG/ML IV BOLUS
1.0000 mg/kg | Freq: Once | INTRAVENOUS | Status: AC
  Administered 2022-11-21: 20 mg via INTRAVENOUS
  Filled 2022-11-21: qty 20

## 2022-11-21 MED ORDER — ONDANSETRON HCL 4 MG/2ML IJ SOLN
4.0000 mg | Freq: Four times a day (QID) | INTRAMUSCULAR | Status: DC | PRN
  Administered 2022-11-21: 4 mg via INTRAVENOUS
  Filled 2022-11-21: qty 2

## 2022-11-21 MED ORDER — LACTATED RINGERS IV BOLUS: 1000.0000 mL | Freq: Once | INTRAVENOUS | Status: DC

## 2022-11-21 MED ORDER — FENTANYL CITRATE PF 50 MCG/ML IJ SOSY
100.0000 ug | PREFILLED_SYRINGE | Freq: Once | INTRAMUSCULAR | Status: AC
  Administered 2022-11-21: 100 ug via INTRAVENOUS
  Filled 2022-11-21: qty 2

## 2022-11-21 MED ORDER — OXYCODONE-ACETAMINOPHEN 5-325 MG PO TABS
1.0000 | ORAL_TABLET | Freq: Once | ORAL | Status: AC
  Administered 2022-11-21: 1 via ORAL
  Filled 2022-11-21: qty 1

## 2022-11-21 MED ORDER — FENTANYL CITRATE PF 50 MCG/ML IJ SOSY
50.0000 ug | PREFILLED_SYRINGE | INTRAMUSCULAR | Status: DC | PRN
  Administered 2022-11-21 (×4): 50 ug via INTRAVENOUS
  Filled 2022-11-21 (×4): qty 1

## 2022-11-21 NOTE — ED Notes (Signed)
Pt writhing in pain, toes warm, full sensation, ace wrap intact with posterior splint.
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
## 2022-11-21 NOTE — ED Notes (Signed)
Patient given ice pack for comfort
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
## 2022-11-21 NOTE — Progress Notes (Signed)
Orthopedic Tech Progress Note Patient Details:  Jay Davila Mar 02, 1986 [id number]  Ortho Devices Type of Ortho Device: Post (short leg) splint Ortho Device/Splint Location: rle Ortho Device/Splint Interventions: Ordered, Application, Adjustment  There was a small scrape that I covered with a 2x2 guaze and wrapped loosely with webrill before applying the splint. Post Interventions Patient Tolerated: Well Instructions Provided: Care of device, Adjustment of device  [name] 11/21/2022, 2:50 AM

## 2022-11-21 NOTE — ED Notes (Signed)
Patient's foot elevated for comfort.

## 2022-11-21 NOTE — ED Provider Notes (Signed)
Care of patient assumed from Dr. Christy Gentles.  This patient sustained a right foot injury last night.  Imaging shows comminuted displaced fracture of anterior process of calcaneus and inferior aspect of lateral cuneiform.  This was discussed with orthopedic surgeon, Dr. Alvan Dame who will discuss with foot and ankle specialist, Dr. Doran Durand. Physical Exam  BP (!) 140/91   Pulse 70   Temp 98 F (36.7 C) (Oral)   Resp 16   SpO2 97%   Physical Exam Vitals and nursing note reviewed.  Constitutional:      General: He is not in acute distress.    Appearance: Normal appearance. He is well-developed. He is not ill-appearing, toxic-appearing or diaphoretic.  HENT:     Head: Normocephalic and atraumatic.     Right Ear: External ear normal.     Left Ear: External ear normal.     Nose: Nose normal.     Mouth/Throat:     Mouth: Mucous membranes are moist.  Eyes:     Extraocular Movements: Extraocular movements intact.     Conjunctiva/sclera: Conjunctivae normal.  Cardiovascular:     Rate and Rhythm: Normal rate and regular rhythm.     Heart sounds: No murmur heard. Pulmonary:     Effort: Pulmonary effort is normal. No respiratory distress.  Abdominal:     General: There is no distension.     Palpations: Abdomen is soft.     Tenderness: There is no abdominal tenderness.  Musculoskeletal:        General: Tenderness, deformity and signs of injury present.     Cervical back: Normal range of motion and neck supple.  Skin:    General: Skin is warm and dry.     Capillary Refill: Capillary refill takes less than 2 seconds.     Coloration: Skin is not jaundiced or pale.  Neurological:     General: No focal deficit present.     Mental Status: He is alert and oriented to person, place, and time.     Cranial Nerves: No cranial nerve deficit.     Sensory: No sensory deficit.     Motor: No weakness.     Coordination: Coordination normal.  Psychiatric:        Mood and Affect: Mood normal.        Behavior:  Behavior normal.        Thought Content: Thought content normal.        Judgment: Judgment normal.     Procedures  Procedures  ED Course / MDM   Clinical Course as of 11/21/22 0740  Mon Nov 21, 2022  0203 Discussed with Dr. Alvan Dame with orthopedics.  He will review the imaging and call me back [DW]  0214 Discussed case with Dr. Alvan Dame at [phone].  He has reviewed imaging.  He request to put placement and a splint, get CT of his foot.  He will have the foot and ankle specialist reviewed the imaging and give [place] a plan.  He does not require immediate operative management.  He does not require closed reduction [DW]  0706 Patient stable overnight.  We are waiting orthopedic disposition.  Plan will be to hear back from Dr. Alvan Dame after discussion with Dr. Doran Durand the foot and ankle specialist.  Signed out to Dr. Doren Custard at shift change [DW]    Clinical Course User Index [DW] Ripley Fraise, MD   Medical Decision Making Amount and/or Complexity of Data Reviewed Labs: ordered. Radiology: ordered.  Risk Prescription drug management.  Following imaging studies, Dr. Charlann Boxer discussed with Dr. Victorino Dike regarding management.  I spoke with Dr. Charlann Boxer over the phone who states that current plan will be for attempt of closed reduction at bedside under sedation.  Patient can then follow-up with Dr. Victorino Dike on Wednesday.  Additional fentanyl was given for analgesia.  Patient underwent sedation with ketamine and propofol.  Closed reduction was performed by PA Tinnie Gens.  Repeat x-ray showed improved alignment.  Orthopedic surgery reviewed postreduction x-ray and does feel comfortable with patient being discharged with outpatient follow-up.  Patient was discharged in stable condition.        Gloris Manchester, MD 11/21/22 1100

## 2022-11-21 NOTE — Procedures (Signed)
Procedure: Right foot closed reduction   Indication: Right foot fractures   Surgeon: Silvestre Gunner, PA-C   Assist: None   Anesthesia: Ketamine and propofol via EDP   EBL: None   Complications: None   Findings: After risks/benefits explained patient desires to undergo procedure. Consent obtained and time out performed. Sedation given and confirmed. Foot reduced. Post-reduction films pending. Pt tolerated the procedure well.       Lisette Abu, PA-C Orthopedic Surgery [phone]

## 2022-11-21 NOTE — Discharge Instructions (Addendum)
There is a telephone number below for an orthopedic doctor who specializes in ankle and feet injuries.  He wants to see you in his office on Wednesday.  Call the number to set up that appointment.  Take ibuprofen and Tylenol for pain.  If you continue to have severe pain, a prescription for narcotic pain medication was sent to your pharmacy.  Take this only as needed.

## 2022-11-21 NOTE — ED Notes (Signed)
Splint removed. Silvestre Gunner, PA notified regarding severe pain-- one time order for Fentanyl 136mcg IV .

## 2022-11-21 NOTE — ED Provider Notes (Signed)
.  Sedation  Date/Time: 11/21/2022 9:33 AM  Performed by: Godfrey Pick, MD Authorized by: Godfrey Pick, MD   Consent:    Consent obtained:  Verbal and written   Consent given by:  Patient   Risks discussed:  Allergic reaction, inadequate sedation, respiratory compromise necessitating ventilatory assistance and intubation, nausea and vomiting Universal protocol:    Imaging studies available: yes     Immediately prior to procedure, a time out was called: yes     Patient identity confirmed:  Verbally with patient and arm band Indications:    Procedure performed:  Fracture reduction   Procedure necessitating sedation performed by:  Different physician Pre-sedation assessment:    Time since last food or drink:  6 hours   ASA classification: class 1 - normal, healthy patient     Mouth opening:  3 or more finger widths   Thyromental distance:  3 finger widths   Mallampati score:  II - soft palate, uvula, fauces visible   Neck mobility: normal     Pre-sedation assessments completed and reviewed: airway patency, cardiovascular function, hydration status, mental status, nausea/vomiting, pain level and respiratory function     Pre-sedation assessment completed:  11/21/2022 9:00 AM Immediate pre-procedure details:    Reassessment: Patient reassessed immediately prior to procedure     Reviewed: vital signs     Verified: bag valve mask available, emergency equipment available, intubation equipment available, IV patency confirmed and oxygen available   Procedure details (see MAR for exact dosages):    Preoxygenation:  Nasal cannula   Sedation:  Ketamine and propofol   Intended level of sedation: moderate (conscious sedation)   Analgesia:  Fentanyl   Intra-procedure monitoring:  Blood pressure monitoring, cardiac monitor, frequent LOC assessments, continuous pulse oximetry, continuous capnometry and frequent vital sign checks   Intra-procedure events: none     Total Provider sedation time (minutes):   12 Post-procedure details:    Post-sedation assessment completed:  11/21/2022 9:35 AM   Attendance: Constant attendance by certified staff until patient recovered     Recovery: Patient returned to pre-procedure baseline     Post-sedation assessments completed and reviewed: airway patency, cardiovascular function, hydration status, mental status, nausea/vomiting, pain level and respiratory function     Patient is stable for discharge or admission: yes     Procedure completion:  Tolerated well, no immediate complications     Godfrey Pick, MD 11/21/22 [phone]

## 2022-11-21 NOTE — ED Notes (Signed)
Pt states pain is some better--- positive pedal pulse by doppler,

## 2022-11-21 NOTE — Progress Notes (Signed)
RT present at beside for conscious sedation. PT was placed on end tidal CO2 for prior procedure. PT tolerated procedure well. No RT intervention needed.

## 2022-11-21 NOTE — Progress Notes (Signed)
Orthopedic Tech Progress Note Patient Details:  Jay Davila 26-May-1986 [id number]  Patient has crutches already   Patient ID: Jay Davila, male   DOB: 04-Dec-1985, 37 y.o.   MRN: [id number]  [name] 11/21/2022, 10:19 AM

## 2022-11-21 NOTE — Progress Notes (Signed)
Orthopedic Tech Progress Note Patient Details:  Jay Davila 06-Dec-1985 [id number]  PA did a reduction of the foot on patient shortly ago, and I had to reapply splint to patient   Ortho Devices Type of Ortho Device: Ace wrap, Stirrup splint, Short leg splint Ortho Device/Splint Location: RLE Ortho Device/Splint Interventions: Ordered, Application   Post Interventions Patient Tolerated: Well Instructions Provided: Care of device  [name] 11/21/2022, 9:31 AM

## 2022-12-16 ENCOUNTER — Emergency Department (HOSPITAL_COMMUNITY)
Admission: EM | Admit: 2022-12-16 | Discharge: 2022-12-16 | Disposition: A | Discharge status: Home / Self Care | Payer: BLUE CROSS/BLUE SHIELD | Attending: Emergency Medicine | Admitting: Emergency Medicine

## 2022-12-16 ENCOUNTER — Emergency Department (HOSPITAL_COMMUNITY): Payer: BLUE CROSS/BLUE SHIELD

## 2022-12-16 ENCOUNTER — Encounter (HOSPITAL_COMMUNITY): Payer: Self-pay | Admitting: *Deleted

## 2022-12-16 ENCOUNTER — Other Ambulatory Visit: Payer: Self-pay

## 2022-12-16 DIAGNOSIS — Y92009 Unspecified place in unspecified non-institutional (private) residence as the place of occurrence of the external cause: Secondary | ICD-10-CM | POA: Insufficient documentation

## 2022-12-16 DIAGNOSIS — S01312A Laceration without foreign body of left ear, initial encounter: Secondary | ICD-10-CM

## 2022-12-16 DIAGNOSIS — M79671 Pain in right foot: Secondary | ICD-10-CM | POA: Diagnosis not present

## 2022-12-16 DIAGNOSIS — J45909 Unspecified asthma, uncomplicated: Secondary | ICD-10-CM | POA: Insufficient documentation

## 2022-12-16 DIAGNOSIS — W500XXA Accidental hit or strike by another person, initial encounter: Secondary | ICD-10-CM | POA: Insufficient documentation

## 2022-12-16 DIAGNOSIS — S90411A Abrasion, right great toe, initial encounter: Secondary | ICD-10-CM | POA: Insufficient documentation

## 2022-12-16 DIAGNOSIS — S0991XA Unspecified injury of ear, initial encounter: Secondary | ICD-10-CM | POA: Diagnosis present

## 2022-12-16 MED ORDER — ACETAMINOPHEN 500 MG PO TABS
1000.0000 mg | ORAL_TABLET | Freq: Once | ORAL | Status: AC
  Administered 2022-12-16: 1000 mg via ORAL
  Filled 2022-12-16: qty 2

## 2022-12-16 NOTE — Progress Notes (Signed)
Orthopedic Tech Progress Note Patient Details:  Jay Davila 06-15-1986 [id number]  Ortho Devices Type of Ortho Device: Crutches, Ace wrap Ortho Device/Splint Location: rle ankle ace wrap Ortho Device/Splint Interventions: Ordered, Adjustment, Application   Post Interventions Patient Tolerated: Well Instructions Provided: Care of device, Adjustment of device  [name] 12/16/2022, 7:26 AM

## 2022-12-16 NOTE — ED Triage Notes (Signed)
Pt fractured and dislocated his R ankle a month ago. Reinjured it tonight after falling at home tonight, swelling noted to R foot and ankle. Also c/o L jaw pain after hitting his face on a desk when he fell. Increased pain when opening his mouth

## 2022-12-16 NOTE — Discharge Instructions (Signed)
You were evaluated in the Emergency Department and after careful evaluation, we did not find any emergent condition requiring admission or further testing in the hospital.  Your exam/testing today is overall reassuring.  We repaired your laceration here in the emergency department.  Important that you follow-up with the orthopedic specialist regarding your foot.  Wear the boot.  Please return to the Emergency Department if you experience any worsening of your condition.   Thank you for allowing [place] to be a part of your care.

## 2022-12-16 NOTE — ED Provider Notes (Addendum)
[facility] Provider Note   CSN: [id number] Arrival date & time: 12/16/22  0214     History  Chief Complaint  Patient presents with   Ankle Pain    Jay Davila is a 37 y.o. male.  The history is provided by the patient and medical records. No language interpreter was used.  Ankle Pain    37 year old male with significant history of asthma and recent fracture dislocation of his right ankle presenting complaining of right foot injury.  Patient reports tonight someone accidentally stepped on his right foot.  States he fell and struck the left side of his jaw against a hard object but did not have any syncopal episode.  He is complaining of pain to his left ear left jaw as well as pain to his right foot/toe.  Pain is moderate in intensity worse with movement.  He reported prior recent right ankle fracture dislocation doing nonoperatively.  Home Medications Prior to Admission medications   Medication Sig Start Date End Date Taking? Authorizing Provider  albuterol (PROVENTIL HFA;VENTOLIN HFA) 108 (90 BASE) MCG/ACT inhaler Inhale 2 puffs into the lungs every 6 (six) hours as needed. For wheezing  Patient not taking: Reported on 11/21/2022    [provider]  albuterol (PROVENTIL) (2.5 MG/3ML) 0.083% nebulizer solution Take 2.5 mg by nebulization every 6 (six) hours as needed. For wheezing  Patient not taking: Reported on 11/21/2022    [provider]  ondansetron (ZOFRAN) 4 MG tablet Take 1 tablet (4 mg total) by mouth every 6 (six) hours. Patient not taking: Reported on 11/21/2022 11/17/22   Small, Brooke L, PA  tamsulosin (FLOMAX) 0.4 MG CAPS capsule Take 1 capsule (0.4 mg total) by mouth daily. Patient not taking: Reported on 11/21/2022 11/17/22   Small, Brooke L, PA      Allergies    Aspirin and Shellfish allergy    Review of Systems   Review of Systems  All other systems reviewed and are negative.   Physical Exam Updated  Vital Signs BP 117/82   Pulse 66   Temp 98.2 F (36.8 C) (Oral)   Resp 16  Physical Exam Vitals and nursing note reviewed.  Constitutional:      General: He is not in acute distress.    Appearance: He is well-developed.  HENT:     Head: Normocephalic.     Comments: Tenderness noted to left angle of the jaw with some swelling noted but no obvious deformity and no malocclusion.  There is a superficial 1 cm vertical laceration noted to outer ear involving the helix Eyes:     Conjunctiva/sclera: Conjunctivae normal.  Musculoskeletal:        General: Signs of injury (Right foot: There is some small abrasion noted to the dorsum of the great toe with tenderness to palpation.  Edema and tenderness noted to right ankle medially) present.     Cervical back: Neck supple.  Skin:    Findings: No rash.  Neurological:     Mental Status: He is alert and oriented to person, place, and time.     ED Results / Procedures / Treatments   Labs (all labs ordered are listed, but only abnormal results are displayed) Labs Reviewed - No data to display  EKG None  Radiology CT Maxillofacial Wo Contrast  Result Date: 12/16/2022 CLINICAL DATA:  Fall, left jaw pain the EXAM: CT MAXILLOFACIAL WITHOUT CONTRAST TECHNIQUE: Multidetector CT imaging of the maxillofacial structures was performed.  Multiplanar CT image reconstructions were also generated. RADIATION DOSE REDUCTION: This exam was performed according to the departmental dose-optimization program which includes automated exposure control, adjustment of the mA and/or kV according to patient size and/or use of iterative reconstruction technique. COMPARISON:  None Available. FINDINGS: Osseous: No evidence of maxillofacial fracture. Nasal bones are intact. Mandible is intact. Bilateral mandibular condyles are well-seated in the TMJs. Orbits: Bilateral orbits, including the globes and retroconal soft tissues, are within normal limits. Sinuses: The visualized  paranasal sinuses are essentially clear. The mastoid air cells are unopacified. Soft tissues: Negative. Limited intracranial: No significant or unexpected finding. IMPRESSION: Negative maxillofacial CT. Electronically Signed   By: Julian Hy M.D.   On: 12/16/2022 03:13   DG Foot Complete Right  Result Date: 12/16/2022 CLINICAL DATA:  Fall EXAM: RIGHT FOOT COMPLETE - 3+ VIEW COMPARISON:  None Available. FINDINGS: No fracture or dislocation is seen. The joint spaces are preserved. Visualized soft tissues are within normal limits. IMPRESSION: Negative. Electronically Signed   By: Julian Hy M.D.   On: 12/16/2022 03:12    Procedures Procedures    Medications Ordered in ED Medications  acetaminophen (TYLENOL) tablet 1,000 mg (has no administration in time range)    ED Course/ Medical Decision Making/ A&P                             Medical Decision Making Amount and/or Complexity of Data Reviewed Radiology: ordered.  Risk OTC drugs.   BP 117/82   Pulse 66   Temp 98.2 F (36.8 C) (Oral)   Resp 71   76:4 AM  37 year old male with significant history of asthma and recent fracture dislocation of his right ankle presenting complaining of right foot injury.  Patient reports tonight someone accidentally stepped on his right foot.  States he fell and struck the left side of his jaw against a hard object but did not have any syncopal episode.  He is complaining of pain to his left ear left jaw as well as pain to his right foot/toe.  Pain is moderate in intensity worse with movement.  He reported prior recent right ankle fracture dislocation treated nonoperatively.  On exam, patient is sitting up in bed appears to be in no acute discomfort.  Head exam remarkable for small superficial laceration to the helix of the left ear.  Send sensation and hearing is intact.  No other signs of head trauma aside from edema and tenderness to the left angle of the jaw.  Patient is able to open his  jaw.  Right foot with small skin tear noted to the dorsum of first metatarsal region without deformity.   -EKG interpreted by me showing NSR -imaging independently viewed and interpreted by me and I agree with radiologist's interpretation.  Result remarkable for xray of R foot is negative.  Xray of  -DDx: strain, sprain, fx, dislocafion -treatment includes tylenol -PCP office notes or outside notes reviewed -Escalation to admission/observation considered: patients feels much better, is comfortable with discharge, and will follow up with PCP -Prescription medication considered, patient comfortable with NSAIDs and muscle relaxant   6:40 AM Left the laceration was Dermabond by Dr. Sedonia Small.  Patient stable for discharge     Final Clinical Impression(s) / ED Diagnoses Final diagnoses:  Laceration of left earlobe, initial encounter  Right foot pain    Rx / DC Orders ED Discharge Orders     None  Domenic Moras, PA-C 12/16/22 0639    Domenic Moras, PA-C 12/16/22 7628    Maudie Flakes, MD 12/16/22 [phone]

## 2022-12-16 NOTE — ED Provider Notes (Signed)
..  Laceration Repair  Date/Time: 12/16/2022 7:20 AM  Performed by: Maudie Flakes, MD Authorized by: Maudie Flakes, MD   Consent:    Consent obtained:  Verbal   Consent given by:  Patient   Risks, benefits, and alternatives were discussed: yes     Risks discussed:  Infection, need for additional repair, nerve damage, poor wound healing, poor cosmetic result, pain, retained foreign body, tendon damage and vascular damage Universal protocol:    Procedure explained and questions answered to patient or proxy's satisfaction: yes     Immediately prior to procedure, a time out was called: yes     Patient identity confirmed:  Verbally with patient Anesthesia:    Anesthesia method:  None Laceration details:    Location:  Ear   Ear location:  L ear   Length (cm):  1   Depth (mm):  1 Exploration:    Wound exploration: wound explored through full range of motion and entire depth of wound visualized     Contaminated: no   Treatment:    Amount of cleaning:  Standard Skin repair:    Repair method:  Tissue adhesive Approximation:    Approximation:  Close Repair type:    Repair type:  Simple Post-procedure details:    Procedure completion:  Tolerated well, no immediate complications     Maudie Flakes, MD 12/16/22 [phone]

## 2022-12-20 ENCOUNTER — Emergency Department (HOSPITAL_COMMUNITY): Payer: BLUE CROSS/BLUE SHIELD

## 2022-12-20 ENCOUNTER — Emergency Department (HOSPITAL_COMMUNITY)
Admission: EM | Admit: 2022-12-20 | Discharge: 2022-12-20 | Disposition: A | Discharge status: Home / Self Care | Payer: BLUE CROSS/BLUE SHIELD | Attending: Emergency Medicine | Admitting: Emergency Medicine

## 2022-12-20 DIAGNOSIS — J45909 Unspecified asthma, uncomplicated: Secondary | ICD-10-CM | POA: Diagnosis not present

## 2022-12-20 DIAGNOSIS — S0001XA Abrasion of scalp, initial encounter: Secondary | ICD-10-CM | POA: Diagnosis not present

## 2022-12-20 DIAGNOSIS — S060X0A Concussion without loss of consciousness, initial encounter: Secondary | ICD-10-CM | POA: Diagnosis not present

## 2022-12-20 DIAGNOSIS — S0990XA Unspecified injury of head, initial encounter: Secondary | ICD-10-CM | POA: Diagnosis present

## 2022-12-20 DIAGNOSIS — Z23 Encounter for immunization: Secondary | ICD-10-CM | POA: Diagnosis not present

## 2022-12-20 DIAGNOSIS — W208XXA Other cause of strike by thrown, projected or falling object, initial encounter: Secondary | ICD-10-CM | POA: Insufficient documentation

## 2022-12-20 MED ORDER — ACETAMINOPHEN 500 MG PO TABS: 500.0000 mg | ORAL_TABLET | Freq: Four times a day (QID) | ORAL | 0 refills | Status: AC | PRN

## 2022-12-20 MED ORDER — ONDANSETRON 4 MG PO TBDP: 4.0000 mg | ORAL_TABLET | Freq: Three times a day (TID) | ORAL | 0 refills | Status: AC | PRN

## 2022-12-20 MED ORDER — ACETAMINOPHEN 325 MG PO TABS
650.0000 mg | ORAL_TABLET | Freq: Once | ORAL | Status: AC
  Administered 2022-12-20: 650 mg via ORAL
  Filled 2022-12-20: qty 2

## 2022-12-20 MED ORDER — TETANUS-DIPHTH-ACELL PERTUSSIS 5-2.5-18.5 LF-MCG/0.5 IM SUSY
0.5000 mL | PREFILLED_SYRINGE | Freq: Once | INTRAMUSCULAR | Status: AC
  Administered 2022-12-20: 0.5 mL via INTRAMUSCULAR
  Filled 2022-12-20: qty 0.5

## 2022-12-20 NOTE — Discharge Instructions (Addendum)
You were seen today for minor head injury.  Your CT scan is negative.  You likely sustained a concussion.  It is very important that you rest.  Try to keep the lights low and decrease stimulation.

## 2022-12-20 NOTE — ED Triage Notes (Signed)
Patient arrived stating a monkey wrench fell and hit the top of his head prior to arrival. States he feels like he is slower to answer and has some nausea. No LOC.

## 2022-12-20 NOTE — ED Provider Notes (Signed)
[place] EMERGENCY DEPARTMENT AT [facility] Provider Note   CSN: [id number] Arrival date & time: 12/20/22  0012     History  Chief Complaint  Patient presents with   Head Injury    Jay Davila is a 37 y.o. male.  HPI     This is a 37 year old male who presents following a head injury.  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  He believes it fell from 2 to 3 feet above him.  He did not lose consciousness.  However since that time he states that he has felt very foggy headed and has had difficulty expressing himself.  Does report nausea.  Also reports that he reinjured his right foot.  Recent history of fracture for which she is in a boot.  Denies numbness or tingling. Home Medications Prior to Admission medications   Medication Sig Start Date End Date Taking? Authorizing Provider  acetaminophen (TYLENOL) 500 MG tablet Take 1 tablet (500 mg total) by mouth every 6 (six) hours as needed. 12/20/22  Yes Binyamin Nelis, Barbette Hair, MD  ondansetron (ZOFRAN-ODT) 4 MG disintegrating tablet Take 1 tablet (4 mg total) by mouth every 8 (eight) hours as needed for nausea or vomiting. 12/20/22  Yes Devlyn Parish, Barbette Hair, MD  ondansetron (ZOFRAN-ODT) 4 MG disintegrating tablet Take 1 tablet (4 mg total) by mouth every 8 (eight) hours as needed for nausea or vomiting. 12/20/22  Yes Lucianne Smestad, Barbette Hair, MD      Allergies    Aspirin and Shellfish allergy    Review of Systems   Review of Systems  Skin:  Positive for wound.    Physical Exam Updated Vital Signs BP 121/60   Pulse (!) 56   Temp 98.1 F (36.7 C) (Oral)   Resp 16   Ht 1.702 m (5\' 7" )   Wt 63.5 kg   SpO2 96%   BMI 21.93 kg/m  Physical Exam Vitals and nursing note reviewed.  Constitutional:      Appearance: He is well-developed. He is not ill-appearing.  HENT:     Head: Normocephalic.     Comments: small abrasion midline frontal scalp Eyes:     Pupils: Pupils are equal, round, and  reactive to light.  Cardiovascular:     Rate and Rhythm: Normal rate and regular rhythm.  Pulmonary:     Effort: Pulmonary effort is normal. No respiratory distress.     Breath sounds: Normal breath sounds. No wheezing.  Abdominal:     Palpations: Abdomen is soft.     Tenderness: There is no abdominal tenderness.  Musculoskeletal:     Cervical back: Neck supple.  Lymphadenopathy:     Cervical: No cervical adenopathy.  Skin:    General: Skin is warm and dry.  Neurological:     Mental Status: He is alert and oriented to person, place, and time.     Comments: Cranial nerves II through XII intact, 5 out of 5 strength in all 4 extremities, no dysmetria to finger-nose-finger, occasionally patient appears to have difficulty remembering what he is talking with in his stream of consciousness appears interrupted.  Psychiatric:     Comments: Tearful at times     ED Results / Procedures / Treatments   Labs (all labs ordered are listed, but only abnormal results are displayed) Labs Reviewed - No data to display  EKG None  Radiology CT Head Wo Contrast  Result Date: 12/20/2022 CLINICAL DATA:  Status post  trauma. EXAM: CT HEAD WITHOUT CONTRAST TECHNIQUE: Contiguous axial images were obtained from the base of the skull through the vertex without intravenous contrast. RADIATION DOSE REDUCTION: This exam was performed according to the departmental dose-optimization program which includes automated exposure control, adjustment of the mA and/or kV according to patient size and/or use of iterative reconstruction technique. COMPARISON:  Mar 23, 2009 FINDINGS: Brain: No evidence of acute infarction, hemorrhage, hydrocephalus, extra-axial collection or mass lesion/mass effect. Vascular: No hyperdense vessel or unexpected calcification. Skull: Normal. Negative for fracture or focal lesion. Sinuses/Orbits: No acute finding. Other: None. IMPRESSION: No acute intracranial pathology. Electronically Signed   By:  Virgina Norfolk M.D.   On: 12/20/2022 03:01   DG Foot Complete Right  Result Date: 12/20/2022 CLINICAL DATA:  History of right foot trauma with continued right foot pain. EXAM: RIGHT FOOT COMPLETE - 3+ VIEW COMPARISON:  December 16, 2022 FINDINGS: There is no evidence of fracture or dislocation. There is no evidence of arthropathy or other focal bone abnormality. Soft tissues are unremarkable. IMPRESSION: Negative. Electronically Signed   By: Virgina Norfolk M.D.   On: 12/20/2022 01:44    Procedures Procedures    Medications Ordered in ED Medications  acetaminophen (TYLENOL) tablet 650 mg (has no administration in time range)  Tdap (BOOSTRIX) injection 0.5 mL (has no administration in time range)    ED Course/ Medical Decision Making/ A&P                             Medical Decision Making Amount and/or Complexity of Data Reviewed Radiology: ordered.  Risk OTC drugs. Prescription drug management.   This patient presents to the ED for concern of head injury, this involves an extensive number of treatment options, and is a complaint that carries with it a high risk of complications and morbidity.  I considered the following differential and admission for this acute, potentially life threatening condition.  The differential diagnosis includes minor head injury, head bleed, concussion, hematoma  MDM:    This is a 37 year old male who presents after being hit in the head with a monkey wrench.  He is nontoxic.  Vital signs are reassuring.  He is tearful and at times appears to have some difficulty with stream of consciousness and fluid in his of his thought.  He is technically oriented x 3.  He is otherwise neurologically intact.  He does have a small abrasion over the midline frontal scalp.  No significant hematoma.  No other signs of injury.  Also complains of right foot pain.  I have reviewed his chart.  He had a prior comminuted fracture of the anterior process of the calcaneus in  addition to dislocation of his cuboid and cuneiforms in the midfoot.  He has been in a walking boot.  Subsequent x-rays have been reassuring.  CT head obtained.  Patient was given Tylenol and Zofran.  He was given tetanus.  CT scan does not show any intracranial abnormality.  X-rays of the foot were read as negative and similar to prior.  Given patient's symptoms, highly suspect concussion.  We discussed concussion precautions and treatment.  Will discharge with Tylenol and Zofran.  He was encouraged to get good sleep and minimize stimulus.  (Labs, imaging, consults)  Labs: I Ordered, and personally interpreted labs.  The pertinent results include: None  Imaging Studies ordered: I ordered imaging studies including CT head, x-ray right foot I independently visualized and interpreted imaging.  I agree with the radiologist interpretation  Additional history obtained from chart review.  External records from outside source obtained and reviewed including recent imaging of foot  Cardiac Monitoring: The patient was maintained on a cardiac monitor.  I personally viewed and interpreted the cardiac monitored which showed an underlying rhythm of: Sinus rhythm  Reevaluation: After the interventions noted above, I reevaluated the patient and found that they have :stayed the same  Social Determinants of Health:  lives independently  Disposition: Discharge  Co morbidities that complicate the patient evaluation  Past Medical History:  Diagnosis Date   Asthma      Medicines Meds ordered this encounter  Medications   acetaminophen (TYLENOL) tablet 650 mg   ondansetron (ZOFRAN-ODT) 4 MG disintegrating tablet    Sig: Take 1 tablet (4 mg total) by mouth every 8 (eight) hours as needed for nausea or vomiting.    Dispense:  20 tablet    Refill:  0   Tdap (BOOSTRIX) injection 0.5 mL   ondansetron (ZOFRAN-ODT) 4 MG disintegrating tablet    Sig: Take 1 tablet (4 mg total) by mouth every 8 (eight)  hours as needed for nausea or vomiting.    Dispense:  20 tablet    Refill:  0   acetaminophen (TYLENOL) 500 MG tablet    Sig: Take 1 tablet (500 mg total) by mouth every 6 (six) hours as needed.    Dispense:  30 tablet    Refill:  0    I have reviewed the patients home medicines and have made adjustments as needed  Problem List / ED Course: Problem List Items Addressed This Visit   None Visit Diagnoses     Concussion without loss of consciousness, initial encounter    -  Primary   Abrasion of scalp, initial encounter                       Final Clinical Impression(s) / ED Diagnoses Final diagnoses:  Concussion without loss of consciousness, initial encounter  Abrasion of scalp, initial encounter    Rx / DC Orders ED Discharge Orders          Ordered    ondansetron (ZOFRAN-ODT) 4 MG disintegrating tablet  Every 8 hours PRN        12/20/22 0342    ondansetron (ZOFRAN-ODT) 4 MG disintegrating tablet  Every 8 hours PRN        12/20/22 0346    acetaminophen (TYLENOL) 500 MG tablet  Every 6 hours PRN        12/20/22 0346              Merryl Hacker, MD 12/20/22 [phone]

## 2022-12-20 NOTE — ED Notes (Signed)
Pt to CT scanner at this time
